# Patient Record
Sex: Male | Born: 1975 | Race: White | Hispanic: No | Marital: Married | Smoking: Never smoker
Health system: Southern US, Community
[De-identification: ages and names within clinical notes are randomized; demographics above are authoritative.]

## PROBLEM LIST (undated history)

## (undated) DIAGNOSIS — C801 Malignant (primary) neoplasm, unspecified: Secondary | ICD-10-CM

## (undated) DIAGNOSIS — F32A Depression, unspecified: Secondary | ICD-10-CM

## (undated) DIAGNOSIS — F419 Anxiety disorder, unspecified: Secondary | ICD-10-CM

## (undated) DIAGNOSIS — E785 Hyperlipidemia, unspecified: Secondary | ICD-10-CM

## (undated) DIAGNOSIS — M1990 Unspecified osteoarthritis, unspecified site: Secondary | ICD-10-CM

## (undated) DIAGNOSIS — I10 Essential (primary) hypertension: Secondary | ICD-10-CM

## (undated) DIAGNOSIS — N200 Calculus of kidney: Secondary | ICD-10-CM

## (undated) HISTORY — PX: COSMETIC SURGERY: SHX468

## (undated) HISTORY — DX: Malignant (primary) neoplasm, unspecified: C80.1

## (undated) HISTORY — DX: Anxiety disorder, unspecified: F41.9

## (undated) HISTORY — DX: Unspecified osteoarthritis, unspecified site: M19.90

## (undated) HISTORY — DX: Depression, unspecified: F32.A

## (undated) HISTORY — PX: KNEE SURGERY: SHX244

## (undated) HISTORY — PX: MOHS SURGERY: SUR867

---

## 2005-01-14 ENCOUNTER — Encounter: Payer: Self-pay | Admitting: Rheumatology

## 2009-10-10 DIAGNOSIS — Z87891 Personal history of nicotine dependence: Secondary | ICD-10-CM | POA: Insufficient documentation

## 2009-12-29 DIAGNOSIS — E7800 Pure hypercholesterolemia, unspecified: Secondary | ICD-10-CM | POA: Insufficient documentation

## 2009-12-29 DIAGNOSIS — E559 Vitamin D deficiency, unspecified: Secondary | ICD-10-CM | POA: Insufficient documentation

## 2015-02-12 LAB — LIPID PANEL
Cholesterol: 267 mg/dL — AB (ref 0–200)
HDL: 46 mg/dL (ref 35–70)
LDL Cholesterol: 186 mg/dL
LDL/HDL RATIO: 4
Triglycerides: 174 mg/dL — AB (ref 40–160)

## 2015-02-12 LAB — CBC AND DIFFERENTIAL
HCT: 49 % (ref 41–53)
Hemoglobin: 17.4 g/dL (ref 13.5–17.5)
NEUTROS ABS: 52 /uL
Platelets: 260 10*3/uL (ref 150–399)
WBC: 6.8 10^3/mL

## 2015-02-12 LAB — TSH: TSH: 3.33 u[IU]/mL (ref <=5.90)

## 2015-02-12 LAB — BASIC METABOLIC PANEL
BUN: 12 mg/dL (ref 4–21)
Creatinine: 1.1 mg/dL (ref <=1.3)
Glucose: 92 mg/dL
Potassium: 4.4 mmol/L (ref 3.4–5.3)
SODIUM: 139 mmol/L (ref 137–147)

## 2015-02-12 LAB — HEPATIC FUNCTION PANEL
ALT: 25 U/L (ref 10–40)
AST: 17 U/L (ref 14–40)
Alkaline Phosphatase: 78 U/L (ref 25–125)
Bilirubin, Total: 0.6 mg/dL

## 2015-02-12 LAB — PSA: PSA: 0.6

## 2015-05-11 DIAGNOSIS — F329 Major depressive disorder, single episode, unspecified: Secondary | ICD-10-CM | POA: Insufficient documentation

## 2015-05-11 DIAGNOSIS — F32A Depression, unspecified: Secondary | ICD-10-CM | POA: Insufficient documentation

## 2015-05-11 DIAGNOSIS — IMO0001 Reserved for inherently not codable concepts without codable children: Secondary | ICD-10-CM | POA: Insufficient documentation

## 2015-05-11 DIAGNOSIS — R030 Elevated blood-pressure reading, without diagnosis of hypertension: Secondary | ICD-10-CM

## 2015-05-19 ENCOUNTER — Ambulatory Visit: Payer: Self-pay | Admitting: Family Medicine

## 2015-07-14 ENCOUNTER — Ambulatory Visit (INDEPENDENT_AMBULATORY_CARE_PROVIDER_SITE_OTHER): Payer: BLUE CROSS/BLUE SHIELD | Admitting: Family Medicine

## 2015-07-14 ENCOUNTER — Encounter: Payer: Self-pay | Admitting: Family Medicine

## 2015-07-14 VITALS — BP 128/90 | HR 82 | Temp 98.3°F | Resp 16 | Ht 68.0 in | Wt 185.0 lb

## 2015-07-14 DIAGNOSIS — F32A Depression, unspecified: Secondary | ICD-10-CM

## 2015-07-14 DIAGNOSIS — R030 Elevated blood-pressure reading, without diagnosis of hypertension: Secondary | ICD-10-CM | POA: Diagnosis not present

## 2015-07-14 DIAGNOSIS — IMO0001 Reserved for inherently not codable concepts without codable children: Secondary | ICD-10-CM

## 2015-07-14 DIAGNOSIS — E7800 Pure hypercholesterolemia, unspecified: Secondary | ICD-10-CM

## 2015-07-14 DIAGNOSIS — F329 Major depressive disorder, single episode, unspecified: Secondary | ICD-10-CM | POA: Diagnosis not present

## 2015-07-14 NOTE — Progress Notes (Signed)
Patient ID: Joe Burns, male   DOB: [birth year], 39 y.o.   MRN: [id number]    Subjective:  HPI   Lipid/Cholesterol, Follow-up:   Last seen for this 2 months ago.  Management changes since that visit include starting LIpitor. He has been taking daily but does at times forgets it-maybe 2 to 3 tablets a month he misses. Last Lipid Panel:    Component Value Date/Time   CHOL 267* 02/12/2015   TRIG 174* 02/12/2015   HDL 46 02/12/2015   LDLCALC 186 02/12/2015     He reports good compliance with treatment. He is not having side effects.  Current symptoms include none and have been stable. Weight trend: stable Prior visit with dietician: no Current diet: not one in place Current exercise: 4 to 5 times a week  Wt Readings from Last 3 Encounters:  07/14/15 185 lb (83.915 kg)  04/28/15 185 lb (83.915 kg)     Depression, Follow-up  He  was last seen for this 3 months ago. Changes made at last visit include none.   He reports good compliance with treatment. He is not having side effects.   He reports good tolerance of treatment. He feels he is Improved since last visit. A little improved maybe.  He does not feel like medication needs to be adjusted.   Elevated blood pressure  On his last visit B/P was 138/98. He has not been checking his B/P outside of the office.      Prior to Admission medications   Medication Sig Start Date End Date Taking? Authorizing Provider  atorvastatin (LIPITOR) 10 MG tablet Take by mouth. 04/28/15   Historical Provider, MD  buPROPion (WELLBUTRIN XL) 300 MG 24 hr tablet Take by mouth. 02/12/15   Historical Provider, MD    Patient Active Problem List   Diagnosis Date Noted  . Clinical depression 05/11/2015  . Blood pressure elevated 05/11/2015  . Hypercholesteremia 12/29/2009  . Avitaminosis D 12/29/2009  . History of tobacco use 10/10/2009    No past medical history on file.  History   Social History  . Marital Status: Married   Spouse Name: Tresa Endo  . Number of Children: 0  . Years of Education: 12   Occupational History  . [employer]    Social History Main Topics  . Smoking status: Never Smoker   . Smokeless tobacco: Former Neurosurgeon    Types: Chew    Quit date: 04/13/2015  . Alcohol Use: Yes     Comment: once or twice a week-2 beers at that time  . Drug Use: No  . Sexual Activity: Yes   Other Topics Concern  . Not on file   Social History Narrative    No Known Allergies  Review of Systems  Constitutional: Negative for fever, chills, malaise/fatigue and diaphoresis.  Respiratory: Negative for cough, hemoptysis, sputum production, shortness of breath and wheezing.   Cardiovascular: Negative for chest pain, palpitations, orthopnea, claudication and leg swelling.  Gastrointestinal: Negative for heartburn, nausea and vomiting.  Musculoskeletal: Negative for myalgias, back pain, falls and neck pain.  Neurological: Negative for dizziness and headaches.  Psychiatric/Behavioral: Negative for depression, suicidal ideas, hallucinations and substance abuse. The patient is not nervous/anxious and does not have insomnia.     Immunization History  Administered Date(s) Administered  . Tdap 09/24/2009   Objective:  BP 128/90 mmHg  Pulse 82  Temp(Src) 98.3 F (36.8 C)  Resp 16  Ht 5\' 8"  (1.727 m)  Wt 185 lb (83.915  kg)  BMI 28.14 kg/m2  Physical Exam  Constitutional: He is oriented to person, place, and time and well-developed, well-nourished, and in no distress.  HENT:  Head: Normocephalic and atraumatic.  Right Ear: External ear normal.  Left Ear: External ear normal.  Nose: Nose normal.  Eyes: Conjunctivae are normal. Pupils are equal, round, and reactive to light.  Neck: Normal range of motion. Neck supple.  Cardiovascular: Normal rate, regular rhythm, normal heart sounds and intact distal pulses.   Pulmonary/Chest: Effort normal and breath sounds normal. No respiratory distress. He has no  wheezes. He has no rales.  Musculoskeletal: Normal range of motion. He exhibits no edema or tenderness.  Neurological: He is alert and oriented to person, place, and time. Gait normal.  Skin: Skin is warm and dry.  Psychiatric: Mood, memory, affect and judgment normal.    Lab Results  Component Value Date   WBC 6.8 02/12/2015   HGB 17.4 02/12/2015   HCT 49 02/12/2015   PLT 260 02/12/2015   CHOL 267* 02/12/2015   TRIG 174* 02/12/2015   HDL 46 02/12/2015   LDLCALC 186 02/12/2015   TSH 3.33 02/12/2015   PSA 0.6 02/12/2015    CMP     Component Value Date/Time   NA 139 02/12/2015   K 4.4 02/12/2015   BUN 12 02/12/2015   CREATININE 1.1 02/12/2015   AST 17 02/12/2015   ALT 25 02/12/2015   ALKPHOS 78 02/12/2015    Assessment and Plan :  1. Hypercholesteremia Will re check levels on Lipitor-pending results.  2. Blood pressure elevated Borderline today. Advised patient to keep check on B/P and bring in readings on the next visit. Continue to exercise and work on habits. Will not make any changes today, will follow. He is not having any cardiac issues at this time.  3. Clinical depression Stable. No changes to medication is needed at this time. Patient feels ok. Continue to follow.Clinically improved.      Patient was seen and examined by Dr. Bosie Clos and note was scribed by Samara Deist, RMA.    Julieanne Manson MD [facility] 07/14/2015 1:52 PM

## 2015-07-15 LAB — HEPATIC FUNCTION PANEL
ALBUMIN: 5.2 g/dL (ref 3.5–5.5)
ALK PHOS: 81 IU/L (ref 39–117)
ALT: 33 IU/L (ref 0–44)
AST: 23 IU/L (ref 0–40)
Bilirubin Total: 0.7 mg/dL (ref 0.0–1.2)
Bilirubin, Direct: 0.16 mg/dL (ref 0.00–0.40)
Total Protein: 7.5 g/dL (ref 6.0–8.5)

## 2015-07-15 LAB — LIPID PANEL WITH LDL/HDL RATIO
Cholesterol, Total: 180 mg/dL (ref 100–199)
HDL: 48 mg/dL (ref >39)
LDL CALC: 106 mg/dL — AB (ref 0–99)
LDl/HDL Ratio: 2.2 ratio units (ref 0.0–3.6)
Triglycerides: 130 mg/dL (ref 0–149)
VLDL Cholesterol Cal: 26 mg/dL (ref 5–40)

## 2015-08-07 ENCOUNTER — Other Ambulatory Visit: Payer: Self-pay | Admitting: Family Medicine

## 2015-09-10 ENCOUNTER — Other Ambulatory Visit: Payer: Self-pay | Admitting: Family Medicine

## 2015-10-16 ENCOUNTER — Other Ambulatory Visit: Payer: Self-pay

## 2015-10-16 DIAGNOSIS — F329 Major depressive disorder, single episode, unspecified: Secondary | ICD-10-CM

## 2015-10-16 DIAGNOSIS — F32A Depression, unspecified: Secondary | ICD-10-CM

## 2015-10-16 NOTE — Telephone Encounter (Signed)
Pharmacy requesting refill. Last refilled on 09/11/15 #30 with 0 refills. Ok to send into pharmacy? Patient's last OV was 07/14/2015.

## 2015-10-20 ENCOUNTER — Other Ambulatory Visit: Payer: Self-pay | Admitting: Family Medicine

## 2015-10-20 MED ORDER — BUPROPION HCL ER (XL) 300 MG PO TB24: 300.0000 mg | ORAL_TABLET | Freq: Every day | ORAL | Status: DC

## 2015-10-20 NOTE — Telephone Encounter (Signed)
Christy with [facility] called to make sure we got refill request and that pt is out of the medication and needs the refill today if possible. Christy asked that the message be marked high priority. Thanks TNP

## 2015-11-03 ENCOUNTER — Ambulatory Visit (INDEPENDENT_AMBULATORY_CARE_PROVIDER_SITE_OTHER): Payer: BLUE CROSS/BLUE SHIELD | Admitting: Family Medicine

## 2015-11-03 ENCOUNTER — Encounter: Payer: Self-pay | Admitting: Family Medicine

## 2015-11-03 VITALS — BP 120/88 | HR 76 | Temp 97.8°F | Resp 16 | Wt 190.0 lb

## 2015-11-03 DIAGNOSIS — F32A Depression, unspecified: Secondary | ICD-10-CM

## 2015-11-03 DIAGNOSIS — F329 Major depressive disorder, single episode, unspecified: Secondary | ICD-10-CM | POA: Diagnosis not present

## 2015-11-03 DIAGNOSIS — R030 Elevated blood-pressure reading, without diagnosis of hypertension: Secondary | ICD-10-CM | POA: Diagnosis not present

## 2015-11-03 DIAGNOSIS — E7800 Pure hypercholesterolemia, unspecified: Secondary | ICD-10-CM

## 2015-11-03 DIAGNOSIS — IMO0001 Reserved for inherently not codable concepts without codable children: Secondary | ICD-10-CM

## 2015-11-03 NOTE — Progress Notes (Signed)
Patient ID: Joe Burns, male   DOB: May 19, 1976, 39 y.o.   MRN: [id number]    Subjective:  HPI  Elevated blood pressure, follow-up:  BP Readings from Last 3 Encounters:  11/03/15 120/88  07/14/15 128/90  04/28/15 138/98    He was last seen for hypertension 3 months ago.  BP at that visit was 128/90. Management since that visit includes none. He reports good compliance with treatment. He is exercising.  Outside blood pressures are 120-150's/80-90's. He is experiencing none.  Patient denies chest pain, chest pressure/discomfort, dyspnea, exertional chest pressure/discomfort, fatigue, irregular heart beat, lower extremity edema and palpitations.   Cardiovascular risk factors include dyslipidemia and male gender.    Wt Readings from Last 3 Encounters:  11/03/15 190 lb (86.183 kg)  07/14/15 185 lb (83.915 kg)  04/28/15 185 lb (83.915 kg)   ------------------------------------------------------------------------  Pt reports that he is emotionally stable and doing well on Wellbutrin.  Prior to Admission medications   Medication Sig Start Date End Date Taking? Authorizing Provider  atorvastatin (LIPITOR) 10 MG tablet TAKE 1 TABLET BY MOUTH EVERY NIGHT AT BEDTIME 10/20/15  Yes Dayzee Trower Hulen Shouts., MD  buPROPion (WELLBUTRIN XL) 300 MG 24 hr tablet Take 1 tablet (300 mg total) by mouth daily. 10/20/15  Yes Asherah Lavoy Hulen Shouts., MD    Patient Active Problem List   Diagnosis Date Noted  . Clinical depression 05/11/2015  . Blood pressure elevated 05/11/2015  . Hypercholesteremia 12/29/2009  . Avitaminosis D 12/29/2009  . History of tobacco use 10/10/2009    History reviewed. No pertinent past medical history.  Social History   Social History  . Marital Status: Married    Spouse Name: Tresa Endo  . Number of Children: 0  . Years of Education: 12   Occupational History  . [employer]    Social History Main Topics  . Smoking status: Never Smoker   . Smokeless  tobacco: Former Neurosurgeon    Types: Chew    Quit date: 04/13/2015  . Alcohol Use: Yes     Comment: once or twice a week-2 beers at that time  . Drug Use: No  . Sexual Activity: Yes   Other Topics Concern  . Not on file   Social History Narrative    No Known Allergies  Review of Systems  Constitutional: Negative.   HENT: Negative.   Eyes: Negative.   Respiratory: Negative.   Cardiovascular: Negative.   Gastrointestinal: Negative.   Genitourinary: Negative.   Musculoskeletal: Negative.   Skin: Negative.   Neurological: Negative.   Endo/Heme/Allergies: Negative.   Psychiatric/Behavioral: Negative.     Immunization History  Administered Date(s) Administered  . Tdap 09/24/2009   Objective:  BP 120/88 mmHg  Pulse 76  Temp(Src) 97.8 F (36.6 C) (Oral)  Resp 16  Wt 190 lb (86.183 kg)  Physical Exam  Constitutional: He is oriented to person, place, and time and well-developed, well-nourished, and in no distress.  HENT:  Head: Normocephalic and atraumatic.  Right Ear: External ear normal.  Left Ear: External ear normal.  Nose: Nose normal.  Eyes: Conjunctivae and EOM are normal. Pupils are equal, round, and reactive to light.  Neck: Normal range of motion. Neck supple.  Cardiovascular: Normal rate, regular rhythm, normal heart sounds and intact distal pulses.   Pulmonary/Chest: Effort normal and breath sounds normal.  Abdominal: Soft.  Musculoskeletal: Normal range of motion.  Neurological: He is alert and oriented to person, place, and time. He has normal reflexes. Gait  normal. GCS score is 15.  Skin: Skin is warm and dry.  Psychiatric: Mood, memory, affect and judgment normal.    Lab Results  Component Value Date   WBC 6.8 02/12/2015   HGB 17.4 02/12/2015   HCT 49 02/12/2015   PLT 260 02/12/2015   CHOL 180 07/14/2015   TRIG 130 07/14/2015   HDL 48 07/14/2015   LDLCALC 106* 07/14/2015   TSH 3.33 02/12/2015   PSA 0.6 02/12/2015    CMP     Component Value  Date/Time   NA 139 02/12/2015   K 4.4 02/12/2015   BUN 12 02/12/2015   CREATININE 1.1 02/12/2015   PROT 7.5 07/14/2015 1420   ALBUMIN 5.2 07/14/2015 1420   AST 23 07/14/2015 1420   ALT 33 07/14/2015 1420   ALKPHOS 81 07/14/2015 1420   BILITOT 0.7 07/14/2015 1420    Assessment and Plan :  1. Blood pressure elevated Still borderline. Will continue to follow. Pt to increase cardio, in hopes it will keep his BP down. Continue to monitor at home, if get more elevated, call to come back in if not, Follow up in 6 months.  2. Clinical depression Stable. Return to clinic 6 months Continue present medications. 3. Hyperlipidemia stable. Continue Atorvastatin.  Labs okay 3 months ago. Patient was seen and examined by Dr. Julieanne Mansonichard Zaiya Annunziato, and noted scribed by Dimas ChyleBrittany Byrd, CMA  Julieanne Mansonichard Adynn Caseres MD [facility] 11/03/2015 8:51 AM

## 2016-05-04 ENCOUNTER — Ambulatory Visit: Payer: BLUE CROSS/BLUE SHIELD | Admitting: Family Medicine

## 2016-05-19 ENCOUNTER — Ambulatory Visit (INDEPENDENT_AMBULATORY_CARE_PROVIDER_SITE_OTHER): Payer: 59 | Admitting: Family Medicine

## 2016-05-19 ENCOUNTER — Encounter: Payer: Self-pay | Admitting: Family Medicine

## 2016-05-19 VITALS — BP 124/88 | HR 82 | Temp 97.7°F | Resp 16 | Wt 189.0 lb

## 2016-05-19 DIAGNOSIS — F329 Major depressive disorder, single episode, unspecified: Secondary | ICD-10-CM

## 2016-05-19 DIAGNOSIS — E7800 Pure hypercholesterolemia, unspecified: Secondary | ICD-10-CM

## 2016-05-19 DIAGNOSIS — IMO0001 Reserved for inherently not codable concepts without codable children: Secondary | ICD-10-CM

## 2016-05-19 DIAGNOSIS — F32A Depression, unspecified: Secondary | ICD-10-CM

## 2016-05-19 DIAGNOSIS — R030 Elevated blood-pressure reading, without diagnosis of hypertension: Secondary | ICD-10-CM | POA: Diagnosis not present

## 2016-05-19 NOTE — Progress Notes (Signed)
Patient ID: Joe Burns, male   DOB: 1976/11/29, 40 y.o.   MRN: [id number]    Subjective:  HPI Elevated blood pressure- Pt is here for a 6 month follow up. His blood pressure has been borderline high. His BP at last OV on 11/03/15 was 120/88. Pt reports that he is exercising at least 3-4 times a week. He does not check it regularly anymore but when he does check it, it seems to be better. He reports that he is separated from his wife and feels his blood pressure is not as high due to this. He has also stopped taking his Wellbutrin because he did not feel it was doing any good any ways. He has not felt any different after stopping the medication.   Prior to Admission medications   Medication Sig Start Date End Date Taking? Authorizing Provider  atorvastatin (LIPITOR) 10 MG tablet TAKE 1 TABLET BY MOUTH EVERY NIGHT AT BEDTIME 10/20/15  Yes Richard Hulen Shouts., MD  buPROPion (WELLBUTRIN XL) 300 MG 24 hr tablet Take 1 tablet (300 mg total) by mouth daily. Patient not taking: Reported on 05/19/2016 10/20/15   Maple Hudson., MD    Patient Active Problem List   Diagnosis Date Noted  . Clinical depression 05/11/2015  . Blood pressure elevated 05/11/2015  . Hypercholesteremia 12/29/2009  . Avitaminosis D 12/29/2009  . History of tobacco use 10/10/2009    History reviewed. No pertinent past medical history.  Social History   Social History  . Marital Status: Married    Spouse Name: Tresa Endo  . Number of Children: 0  . Years of Education: 12   Occupational History  . [employer]    Social History Main Topics  . Smoking status: Never Smoker   . Smokeless tobacco: Former Neurosurgeon    Types: Chew    Quit date: 04/13/2015  . Alcohol Use: Yes     Comment: once or twice a week-2 beers at that time  . Drug Use: No  . Sexual Activity: Yes   Other Topics Concern  . Not on file   Social History Narrative    No Known Allergies  Review of Systems  Constitutional: Negative.     HENT: Negative.   Eyes: Negative.   Respiratory: Negative.   Cardiovascular: Negative.   Gastrointestinal: Negative.   Genitourinary: Negative.   Musculoskeletal: Negative.   Skin: Negative.   Neurological: Negative.   Endo/Heme/Allergies: Negative.   Psychiatric/Behavioral: Negative.     Immunization History  Administered Date(s) Administered  . Tdap 09/24/2009   Objective:  BP 124/88 mmHg  Pulse 82  Temp(Src) 97.7 F (36.5 C) (Oral)  Resp 16  Wt 189 lb (85.73 kg)  Physical Exam  Constitutional: He is oriented to person, place, and time and well-developed, well-nourished, and in no distress.  HENT:  Head: Normocephalic and atraumatic.  Right Ear: External ear normal.  Left Ear: External ear normal.  Nose: Nose normal.  Eyes: Conjunctivae and EOM are normal. Pupils are equal, round, and reactive to light.  Neck: Normal range of motion. Neck supple.  Cardiovascular: Normal rate, regular rhythm, normal heart sounds and intact distal pulses.   Pulmonary/Chest: Effort normal and breath sounds normal.  Musculoskeletal: Normal range of motion.  Neurological: He is alert and oriented to person, place, and time. He has normal reflexes. Gait normal. GCS score is 15.  Skin: Skin is warm and dry.  Nails normal.  Psychiatric: Mood, memory, affect and judgment normal.  Lab Results  Component Value Date   WBC 6.8 02/12/2015   HGB 17.4 02/12/2015   HCT 49 02/12/2015   PLT 260 02/12/2015   CHOL 180 07/14/2015   TRIG 130 07/14/2015   HDL 48 07/14/2015   LDLCALC 106* 07/14/2015   TSH 3.33 02/12/2015   PSA 0.6 02/12/2015    CMP     Component Value Date/Time   NA 139 02/12/2015   K 4.4 02/12/2015   BUN 12 02/12/2015   CREATININE 1.1 02/12/2015   PROT 7.5 07/14/2015 1420   ALBUMIN 5.2 07/14/2015 1420   AST 23 07/14/2015 1420   ALT 33 07/14/2015 1420   ALKPHOS 81 07/14/2015 1420   BILITOT 0.7 07/14/2015 1420    Assessment and Plan :  1.  Hypercholesteremia   2. Blood pressure elevated Improved.   3. Clinical depression Pt stopped Wellbutrin, doing well.   CPE this fall. Labs then. Patient was seen and examined by Dr. Julieanne Mansonichard Gilbert, and noted scribed by Dimas ChyleBrittany Byrd, CMA I have done the exam and reviewed the above chart and it is accurate to the best of my knowledge.  Julieanne Mansonichard Gilbert MD [facility] 05/19/2016 9:08 AM

## 2016-06-11 ENCOUNTER — Encounter: Payer: Self-pay | Admitting: Family Medicine

## 2016-06-11 ENCOUNTER — Other Ambulatory Visit: Payer: Self-pay | Admitting: Family Medicine

## 2016-06-11 ENCOUNTER — Ambulatory Visit (INDEPENDENT_AMBULATORY_CARE_PROVIDER_SITE_OTHER): Payer: 59 | Admitting: Family Medicine

## 2016-06-11 VITALS — BP 140/88 | HR 88 | Temp 97.8°F | Resp 16 | Wt 192.0 lb

## 2016-06-11 DIAGNOSIS — N410 Acute prostatitis: Secondary | ICD-10-CM

## 2016-06-11 LAB — POCT URINALYSIS DIPSTICK
BILIRUBIN UA: NEGATIVE
Glucose, UA: NEGATIVE
KETONES UA: NEGATIVE
Nitrite, UA: NEGATIVE
PH UA: 7
PROTEIN UA: NEGATIVE
SPEC GRAV UA: 1.025
Urobilinogen, UA: 0.2

## 2016-06-11 MED ORDER — DOXYCYCLINE HYCLATE 100 MG PO TABS: 100.0000 mg | ORAL_TABLET | Freq: Two times a day (BID) | ORAL | Status: DC

## 2016-06-11 NOTE — Progress Notes (Signed)
Patient: Joe Burns Male    DOB: [birth year]   40 y.o.   MRN: [id number] Visit Date: 06/11/2016  Today's Provider: Dortha Kernennis Chrismon, PA   Chief Complaint  Patient presents with  . Dysuria   Subjective:    Dysuria  The current episode started more than 1 month ago. The problem occurs intermittently. The patient is experiencing no pain. There has been no fever. Associated symptoms include frequency and urgency. Pertinent negatives include no hematuria.   Patient feels that he may have a prostate infection. He reports that his urinary stream is not as strong as it normally is, and the last time this happened is when he had a prostate infection. Patient reports that he does have to urinate more frequently, and he does feel discomfort after urinating. Patient reports that he has had symptoms a little over 1 month.    No past medical history on file. Patient Active Problem List   Diagnosis Date Noted  . Clinical depression 05/11/2015  . Blood pressure elevated 05/11/2015  . Hypercholesteremia 12/29/2009  . Avitaminosis D 12/29/2009  . History of tobacco use 10/10/2009   Past Surgical History  Procedure Laterality Date  . Knee surgery Right   . Mohs surgery     Family History  Problem Relation Age of Onset  . Hyperlipidemia Mother   . Aneurysm Mother     AORTIC ABDOMINAL-REPAIRED  . Heart attack Father     IN HIS 50S   No Known Allergies   Current Meds  Medication Sig  . atorvastatin (LIPITOR) 10 MG tablet TAKE 1 TABLET BY MOUTH EVERY NIGHT AT BEDTIME    Review of Systems  Constitutional: Negative.   Genitourinary: Positive for dysuria, urgency, frequency, decreased urine volume and difficulty urinating. Negative for hematuria, discharge, penile swelling, scrotal swelling, penile pain and testicular pain.    Social History  Substance Use Topics  . Smoking status: Never Smoker   . Smokeless tobacco: Former NeurosurgeonUser    Types: Chew    Quit date: 04/13/2015  .  Alcohol Use: Yes     Comment: once or twice a week-2 beers at that time   Objective:   BP 140/88 mmHg  Pulse 88  Temp(Src) 97.8 F (36.6 C)  Resp 16  Wt 192 lb (87.091 kg)  Physical Exam  Constitutional: He is oriented to person, place, and time. He appears well-developed and well-nourished. No distress.  HENT:  Head: Normocephalic and atraumatic.  Right Ear: Hearing normal.  Left Ear: Hearing normal.  Nose: Nose normal.  Eyes: Conjunctivae and lids are normal. Right eye exhibits no discharge. Left eye exhibits no discharge. No scleral icterus.  Pulmonary/Chest: Effort normal. No respiratory distress.  Genitourinary: Rectum normal and penis normal.  3+ enlarged prostate without nodules and slight discomfort.  Musculoskeletal: Normal range of motion.  Neurological: He is alert and oriented to person, place, and time.  Skin: Skin is intact. No lesion and no rash noted.  Psychiatric: He has a normal mood and affect. His speech is normal and behavior is normal. Thought content normal.      Assessment & Plan:     1. Acute prostatitis Onset of frequency, decrease in erectile function, slowed stream and some discomfort with urinating over the past month. States the symptoms are the same as his last episode of prostatitis in 2013. Will get urine culture. DRE showed enlarged prostate - stool negative for occult blood. Urinalysis not very remarkable ( only a  trace of blood ). Start Doxycycline and recheck pending C&S. - doxycycline (VIBRA-TABS) 100 MG tablet; Take 1 tablet (100 mg total) by mouth 2 (two) times daily.  Dispense: 28 tablet; Refill: 0 - Urine culture - POCT urinalysis dipstick      Dortha Kernennis Chrismon, PA  [facility]

## 2016-06-11 NOTE — Patient Instructions (Signed)

## 2016-06-14 LAB — URINE CULTURE: Organism ID, Bacteria: NO GROWTH

## 2016-06-17 ENCOUNTER — Telehealth: Payer: Self-pay | Admitting: Family Medicine

## 2016-06-17 NOTE — Telephone Encounter (Signed)
Advise patient urine culture negative. Not uncommon to have negative urine culture with prostatitis. Finish the antibiotic given and recheck if symptoms persist.

## 2016-06-17 NOTE — Telephone Encounter (Signed)
Advised patient as below. Patient will call for an update next week.

## 2016-06-23 ENCOUNTER — Telehealth: Payer: Self-pay | Admitting: Family Medicine

## 2016-06-23 DIAGNOSIS — N410 Acute prostatitis: Secondary | ICD-10-CM

## 2016-06-23 NOTE — Telephone Encounter (Signed)
Pt contacted office for refill request on the following medications:  doxycycline (VIBRA-TABS) 100 MG tablet.  [facility].  CB#[id number]/MW

## 2016-06-23 NOTE — Telephone Encounter (Signed)
Patient was seen on 06/11/16 with acute prostatitis. Patient is still having symptoms and requesting another round of abx. Ok to send? Please advise. Thanks!

## 2016-06-24 MED ORDER — DOXYCYCLINE HYCLATE 100 MG PO TABS: 100.0000 mg | ORAL_TABLET | Freq: Two times a day (BID) | ORAL | Status: DC

## 2016-06-24 NOTE — Telephone Encounter (Signed)
Advised patient as below.  

## 2016-06-24 NOTE — Telephone Encounter (Signed)
Will refill the antibiotic once more. Should recheck in 2 weeks to evaluate progress.

## 2016-08-18 ENCOUNTER — Ambulatory Visit (INDEPENDENT_AMBULATORY_CARE_PROVIDER_SITE_OTHER): Payer: 59 | Admitting: Family Medicine

## 2016-08-18 VITALS — BP 128/84 | HR 80 | Temp 98.3°F | Resp 18 | Wt 181.0 lb

## 2016-08-18 DIAGNOSIS — E7800 Pure hypercholesterolemia, unspecified: Secondary | ICD-10-CM

## 2016-08-18 DIAGNOSIS — F329 Major depressive disorder, single episode, unspecified: Secondary | ICD-10-CM | POA: Diagnosis not present

## 2016-08-18 DIAGNOSIS — F32A Depression, unspecified: Secondary | ICD-10-CM

## 2016-08-18 MED ORDER — ESCITALOPRAM OXALATE 20 MG PO TABS: 20.0000 mg | ORAL_TABLET | Freq: Every day | ORAL | 5 refills | Status: DC

## 2016-08-18 NOTE — Progress Notes (Signed)
Subjective:  HPI  Patient is here to follow up on depression and anxiety. Last visit in June he was doing well off medication but he is going through separation right now and his father just passed away and is not doing as good. He has been seen a counselor and she suggested discussing starting on medication like Lexapro or Zoloft maybe with combination with Wellbutrin. He has not been taking Wellbutrin it was not very effective when he did take it even with increased dose.  Depression screen PHQ 2/9 08/18/2016  Decreased Interest 2  Down, Depressed, Hopeless 1  PHQ - 2 Score 3  Altered sleeping 0  Tired, decreased energy 1  Change in appetite 2  Feeling bad or failure about yourself  1  Trouble concentrating 3  Moving slowly or fidgety/restless 0  Suicidal thoughts 0  PHQ-9 Score 10  Difficult doing work/chores Somewhat difficult     Prior to Admission medications   Medication Sig Start Date End Date Taking? Authorizing Provider  atorvastatin (LIPITOR) 10 MG tablet TAKE 1 TABLET BY MOUTH EVERY NIGHT AT BEDTIME 10/20/15   Richard Hulen Shouts., MD  buPROPion (WELLBUTRIN XL) 300 MG 24 hr tablet Take 1 tablet (300 mg total) by mouth daily. Patient not taking: Reported on 05/19/2016 10/20/15   Maple Hudson., MD  doxycycline (VIBRA-TABS) 100 MG tablet Take 1 tablet (100 mg total) by mouth 2 (two) times daily. 06/24/16   Tamsen Roers, PA    Patient Active Problem List   Diagnosis Date Noted  . Clinical depression 05/11/2015  . Blood pressure elevated 05/11/2015  . Hypercholesteremia 12/29/2009  . Avitaminosis D 12/29/2009  . History of tobacco use 10/10/2009    No past medical history on file.  Social History   Social History  . Marital status: Married    Spouse name: Tresa Endo  . Number of children: 0  . Years of education: 12   Occupational History  . [employer]    Social History Main Topics  . Smoking status: Never Smoker  . Smokeless tobacco:  Former Neurosurgeon    Types: Chew    Quit date: 04/13/2015  . Alcohol use Yes     Comment: once or twice a week-2 beers at that time  . Drug use: No  . Sexual activity: Yes   Other Topics Concern  . Not on file   Social History Narrative  . No narrative on file    No Known Allergies  Review of Systems  Constitutional: Positive for malaise/fatigue.  Eyes: Negative.   Respiratory: Negative.   Cardiovascular: Negative.   Gastrointestinal: Negative.   Genitourinary:       Decreased sex drive  Skin: Negative.   Endo/Heme/Allergies: Negative.   Psychiatric/Behavioral: Positive for depression. The patient is nervous/anxious.     Immunization History  Administered Date(s) Administered  . Tdap 09/24/2009   Objective:  BP 128/84   Pulse 80   Temp 98.3 F (36.8 C)   Resp 18   Wt 181 lb (82.1 kg)   BMI 27.52 kg/m   Physical Exam  Constitutional: He is oriented to person, place, and time and well-developed, well-nourished, and in no distress.  HENT:  Head: Normocephalic and atraumatic.  Right Ear: External ear normal.  Left Ear: External ear normal.  Nose: Nose normal.  Eyes: Conjunctivae are normal. Pupils are equal, round, and reactive to light.  Neck: Normal range of motion. Neck supple.  Cardiovascular: Normal rate, regular rhythm,  normal heart sounds and intact distal pulses.   No murmur heard. Pulmonary/Chest: Effort normal and breath sounds normal. No respiratory distress. He has no wheezes.  Abdominal: Soft.  Musculoskeletal: Normal range of motion. He exhibits no edema or tenderness.  Neurological: He is alert and oriented to person, place, and time. Gait normal.  Skin: Skin is warm and dry.  Psychiatric: Memory, affect and judgment normal.    Lab Results  Component Value Date   WBC 6.8 02/12/2015   HGB 17.4 02/12/2015   HCT 49 02/12/2015   PLT 260 02/12/2015   CHOL 180 07/14/2015   TRIG 130 07/14/2015   HDL 48 07/14/2015   LDLCALC 106 (H) 07/14/2015   TSH  3.33 02/12/2015   PSA 0.6 02/12/2015    CMP     Component Value Date/Time   NA 139 02/12/2015   K 4.4 02/12/2015   BUN 12 02/12/2015   CREATININE 1.1 02/12/2015   PROT 7.5 07/14/2015 1420   ALBUMIN 5.2 07/14/2015 1420   AST 23 07/14/2015 1420   ALT 33 07/14/2015 1420   ALKPHOS 81 07/14/2015 1420   BILITOT 0.7 07/14/2015 1420    Assessment and Plan :  1. Clinical depression PHQ9 score is 10 today. Start Lexapro. Follow up in 1 month. Both mother and sister are doing well on Lexapro. Explained the patient that this is probably the best choice because of manic issues. 2. Hypercholesteremia     HPI, Exam and A&P transcribed under the direction and in the presence of Julieanne Mansonichard Gilbert. MD. I have done the exam and reviewed the above chart and it is accurate to the best of my knowledge.  08/18/2016 8:24 AM

## 2016-09-14 ENCOUNTER — Ambulatory Visit: Payer: 59 | Admitting: Family Medicine

## 2016-11-18 ENCOUNTER — Ambulatory Visit (INDEPENDENT_AMBULATORY_CARE_PROVIDER_SITE_OTHER): Payer: Managed Care, Other (non HMO) | Admitting: Family Medicine

## 2016-11-18 ENCOUNTER — Encounter: Payer: Self-pay | Admitting: Family Medicine

## 2016-11-18 VITALS — BP 122/86 | HR 80 | Temp 98.0°F | Resp 14 | Ht 68.0 in | Wt 189.0 lb

## 2016-11-18 DIAGNOSIS — F329 Major depressive disorder, single episode, unspecified: Secondary | ICD-10-CM | POA: Diagnosis not present

## 2016-11-18 DIAGNOSIS — Z125 Encounter for screening for malignant neoplasm of prostate: Secondary | ICD-10-CM | POA: Diagnosis not present

## 2016-11-18 DIAGNOSIS — R0681 Apnea, not elsewhere classified: Secondary | ICD-10-CM

## 2016-11-18 DIAGNOSIS — R0683 Snoring: Secondary | ICD-10-CM | POA: Diagnosis not present

## 2016-11-18 DIAGNOSIS — F32A Depression, unspecified: Secondary | ICD-10-CM

## 2016-11-18 DIAGNOSIS — Z0000 Encounter for general adult medical examination without abnormal findings: Secondary | ICD-10-CM

## 2016-11-18 LAB — POCT URINALYSIS DIPSTICK
Bilirubin, UA: NEGATIVE
GLUCOSE UA: NEGATIVE
KETONES UA: NEGATIVE
Leukocytes, UA: NEGATIVE
Nitrite, UA: NEGATIVE
PROTEIN UA: NEGATIVE
SPEC GRAV UA: 1.02
UROBILINOGEN UA: 0.2
pH, UA: 6.5

## 2016-11-18 NOTE — Progress Notes (Signed)
Patient: Joe PandyGary D Scogin, Male    DOB: November 06, 1976, 40 y.o.   MRN: [id number] Visit Date: 11/18/2016  Today's Provider: Megan Mansichard Zafirah Vanzee Jr, MD   Chief Complaint  Patient presents with  . Annual Exam   Subjective:    Annual physical exam Joe Burns is a 40 y.o. male who presents today for health maintenance and complete physical. He feels fairly well. He reports exercising 5 times a week. He reports he is sleeping poorly, has been told he stops breathing at night. He is divorced times 2 and has 2 teenage stepchildren who he misses very much. He is getting counselling and is feeling better emotionally.He was married to his first wife for 13 years and he says the marriage ended when she was unfaithful.The second marriage lasted 4 years. His counsellor is Games developerKathy.  He does comment that he snores and people say that he does have apnea.  -----------------------------------------------------------------   Review of Systems  Constitutional: Positive for fatigue.  HENT: Negative.   Eyes: Negative.   Respiratory: Negative.   Cardiovascular: Negative.   Gastrointestinal: Negative.   Endocrine: Negative.   Genitourinary: Negative.   Musculoskeletal: Positive for arthralgias.  Skin: Negative.   Allergic/Immunologic: Negative.   Neurological: Negative.   Hematological: Negative.   Psychiatric/Behavioral: Negative.     Social History      He  reports that he has never smoked. His smokeless tobacco use includes Chew. He reports that he drinks alcohol. He reports that he does not use drugs.       Social History   Social History  . Marital status: Married    Spouse name: Tresa EndoKelly  . Number of children: 0  . Years of education: 12   Occupational History  . [employer]    Social History Main Topics  . Smoking status: Never Smoker  . Smokeless tobacco: Current User    Types: Chew    Last attempt to quit: 04/13/2015  . Alcohol use Yes     Comment: once or twice a week-2  beers at that time  . Drug use: No  . Sexual activity: Yes   Other Topics Concern  . None   Social History Narrative  . None    History reviewed. No pertinent past medical history.   Patient Active Problem List   Diagnosis Date Noted  . Clinical depression 05/11/2015  . Blood pressure elevated 05/11/2015  . Hypercholesteremia 12/29/2009  . Avitaminosis D 12/29/2009  . History of tobacco use 10/10/2009    Past Surgical History:  Procedure Laterality Date  . KNEE SURGERY Right   . MOHS SURGERY      Family History        Family Status  Relation Status  . Mother Alive  . Father Alive  . Sister Alive  . Sister Alive        His family history includes Aneurysm in his mother; Heart attack in his father; Hyperlipidemia in his mother.     No Known Allergies   Current Outpatient Prescriptions:  .  atorvastatin (LIPITOR) 10 MG tablet, TAKE 1 TABLET BY MOUTH EVERY NIGHT AT BEDTIME, Disp: 30 tablet, Rfl: 12   Patient Care Team: Maple Hudsonichard L Iesha Summerhill Jr., MD as PCP - General (Family Medicine)      Objective:   Vitals: BP 122/86 (BP Location: Left Arm, Patient Position: Sitting, Cuff Size: Large)   Pulse 80   Temp 98 F (36.7 C) (Oral)   Resp 14  Ht 5\' 8"  (1.727 m)   Wt 189 lb (85.7 kg)   BMI 28.74 kg/m    Physical Exam  Constitutional: He is oriented to person, place, and time. He appears well-developed and well-nourished.  HENT:  Head: Normocephalic and atraumatic.  Right Ear: External ear normal.  Left Ear: External ear normal.  Nose: Nose normal.  Mouth/Throat: Oropharynx is clear and moist.  Tonsils 2+ bilaterally.  Eyes: Conjunctivae and EOM are normal. Pupils are equal, round, and reactive to light.  Neck: Normal range of motion. Neck supple.  Cardiovascular: Normal rate, regular rhythm, normal heart sounds and intact distal pulses.   Pulmonary/Chest: Effort normal and breath sounds normal.  Abdominal: Soft. Bowel sounds are normal.  Genitourinary:  Penis normal.  Genitourinary Comments: Pt had DRE May 201`7.  Musculoskeletal: Normal range of motion.  Neurological: He is alert and oriented to person, place, and time. He has normal reflexes.  Skin: Skin is warm and dry.  Psychiatric: He has a normal mood and affect. His behavior is normal. Judgment and thought content normal.     Depression Screen PHQ 2/9 Scores 11/18/2016 08/18/2016  PHQ - 2 Score 1 3  PHQ- 9 Score 4 10      Assessment & Plan:     Routine Health Maintenance and Physical Exam  Exercise Activities and Dietary recommendations Goals    None      Immunization History  Administered Date(s) Administered  . Tdap 09/24/2009    Health Maintenance  Topic Date Due  . HIV Screening  05/30/1989  . INFLUENZA VACCINE  11/18/2017 (Originally 07/13/2016)  . TETANUS/TDAP  09/25/2019   Discussed health benefits of physical activity, and encouraged him to engage in regular exercise appropriate for his age and condition.    -------------------------------------------------------------------- 1. Annual physical exam DRE at 2018 CPE. - POCT urinalysis dipstick - TSH - CBC with Differential/Platelet - Lipid Panel With LDL/HDL Ratio - Comprehensive metabolic panel  2. Apnea - Ambulatory referral to Sleep Studies Snoring plus witnessed apnea. 3. Snoring Pt scored a 12 on the Epworth sleepiness scale.  - Ambulatory referral to Sleep Studies Follow up in 2-3 months or after sleep study to discuss treatment and management, if it is needed.   4. Prostate cancer screening  - PSA  5. Depression, unspecified depression type PHQ-9 score is 4 today.  6.Basal Cell Carcinoma Sees Dr Gwen PoundsKowalski. HPI, Exam, and A&P Transcribed under the direction and in the presence of Zetta Stoneman L. Wendelyn BreslowGilbert Jr, MD  Electronically Signed: Dimas ChyleBrittany Byrd, CMA. I have done the exam and reviewed the above chart and it is accurate to the best of my knowledge. [employer]ist[employer]agon  technology has been used in this  note in any air is in the dictation or transcription are unintentional.  Megan Mansichard Daine Gunther Jr, MD  [facility]

## 2016-11-19 LAB — PSA: PROSTATE SPECIFIC AG, SERUM: 1 ng/mL (ref 0.0–4.0)

## 2016-11-19 LAB — COMPREHENSIVE METABOLIC PANEL
ALK PHOS: 66 IU/L (ref 39–117)
ALT: 35 IU/L (ref 0–44)
AST: 27 IU/L (ref 0–40)
Albumin/Globulin Ratio: 1.9 (ref 1.2–2.2)
Albumin: 5 g/dL (ref 3.5–5.5)
BUN/Creatinine Ratio: 13 (ref 9–20)
BUN: 15 mg/dL (ref 6–24)
Bilirubin Total: 0.5 mg/dL (ref 0.0–1.2)
CHLORIDE: 98 mmol/L (ref 96–106)
CO2: 26 mmol/L (ref 18–29)
CREATININE: 1.2 mg/dL (ref 0.76–1.27)
Calcium: 9.9 mg/dL (ref 8.7–10.2)
GFR calc Af Amer: 86 mL/min/{1.73_m2} (ref >59)
GFR calc non Af Amer: 74 mL/min/{1.73_m2} (ref >59)
GLUCOSE: 81 mg/dL (ref 65–99)
Globulin, Total: 2.6 g/dL (ref 1.5–4.5)
Potassium: 4.7 mmol/L (ref 3.5–5.2)
SODIUM: 141 mmol/L (ref 134–144)
Total Protein: 7.6 g/dL (ref 6.0–8.5)

## 2016-11-19 LAB — CBC WITH DIFFERENTIAL/PLATELET
BASOS: 0 %
Basophils Absolute: 0 10*3/uL (ref 0.0–0.2)
EOS (ABSOLUTE): 0.3 10*3/uL (ref 0.0–0.4)
Eos: 4 %
HEMATOCRIT: 52.5 % — AB (ref 37.5–51.0)
HEMOGLOBIN: 18.4 g/dL — AB (ref 13.0–17.7)
IMMATURE GRANS (ABS): 0 10*3/uL (ref 0.0–0.1)
Immature Granulocytes: 0 %
LYMPHS: 35 %
Lymphocytes Absolute: 2.4 10*3/uL (ref 0.7–3.1)
MCH: 30.5 pg (ref 26.6–33.0)
MCHC: 35 g/dL (ref 31.5–35.7)
MCV: 87 fL (ref 79–97)
MONOCYTES: 7 %
Monocytes Absolute: 0.5 10*3/uL (ref 0.1–0.9)
NEUTROS ABS: 3.6 10*3/uL (ref 1.4–7.0)
Neutrophils: 54 %
Platelets: 237 10*3/uL (ref 150–379)
RBC: 6.04 x10E6/uL — ABNORMAL HIGH (ref 4.14–5.80)
RDW: 14.1 % (ref 12.3–15.4)
WBC: 6.8 10*3/uL (ref 3.4–10.8)

## 2016-11-19 LAB — LIPID PANEL WITH LDL/HDL RATIO
Cholesterol, Total: 187 mg/dL (ref 100–199)
HDL: 44 mg/dL (ref >39)
LDL CALC: 121 mg/dL — AB (ref 0–99)
LDl/HDL Ratio: 2.8 ratio units (ref 0.0–3.6)
Triglycerides: 111 mg/dL (ref 0–149)
VLDL CHOLESTEROL CAL: 22 mg/dL (ref 5–40)

## 2016-11-19 LAB — TSH: TSH: 1.71 u[IU]/mL (ref 0.450–4.500)

## 2017-01-12 ENCOUNTER — Other Ambulatory Visit: Payer: Self-pay | Admitting: Family Medicine

## 2017-01-19 ENCOUNTER — Telehealth: Payer: Self-pay | Admitting: Family Medicine

## 2017-01-19 NOTE — Telephone Encounter (Signed)
Insurance denied in lab sleep study.Order faxed to [facility] for home sleep study

## 2017-02-22 ENCOUNTER — Encounter: Payer: Self-pay | Admitting: Family Medicine

## 2017-04-05 ENCOUNTER — Encounter: Payer: Self-pay | Admitting: Family Medicine

## 2017-04-05 ENCOUNTER — Ambulatory Visit (INDEPENDENT_AMBULATORY_CARE_PROVIDER_SITE_OTHER): Payer: Managed Care, Other (non HMO) | Admitting: Family Medicine

## 2017-04-05 VITALS — BP 120/88 | HR 84 | Temp 98.1°F | Resp 16 | Wt 192.0 lb

## 2017-04-05 DIAGNOSIS — J418 Mixed simple and mucopurulent chronic bronchitis: Secondary | ICD-10-CM | POA: Diagnosis not present

## 2017-04-05 MED ORDER — AZITHROMYCIN 250 MG PO TABS: ORAL_TABLET | ORAL | 0 refills | Status: DC

## 2017-04-05 NOTE — Progress Notes (Signed)
Patient: Joe Burns Male    DOB: July 21, 1976   41 y.o.   MRN: [id number] Visit Date: 04/05/2017  Today's Provider: Megan Mans, MD   Chief Complaint  Patient presents with  . URI   Subjective:    URI   This is a new problem. The current episode started more than 1 month ago (for a month and a half. Improved but then worsened about 4 days ago). The problem has been waxing and waning. The maximum temperature recorded prior to his arrival was 100.4 - 100.9 F. Associated symptoms include chest pain (with coughing), congestion, coughing (productive. Has been clear to yellow to lime green), diarrhea, headaches, a plugged ear sensation, rhinorrhea, sinus pain, sneezing, a sore throat, swollen glands (right worse than left) and wheezing (some). Pertinent negatives include no abdominal pain, dysuria, ear pain, nausea, neck pain or vomiting. Treatments tried: Claritin, Mucinex, Sudafed. The treatment provided mild relief.  Pt has a H/O PNA when pt was in his 89's. Pt states this feels similar.     No Known Allergies   Current Outpatient Prescriptions:  .  atorvastatin (LIPITOR) 10 MG tablet, take 1 tablet by mouth at bedtime, Disp: 90 tablet, Rfl: 12  Review of Systems  HENT: Positive for congestion, rhinorrhea, sinus pain, sneezing and sore throat. Negative for ear pain.   Respiratory: Positive for cough (productive. Has been clear to yellow to lime green) and wheezing (some).   Cardiovascular: Positive for chest pain (with coughing).  Gastrointestinal: Positive for diarrhea. Negative for abdominal pain, nausea and vomiting.  Genitourinary: Negative for dysuria.  Musculoskeletal: Negative for neck pain.  Neurological: Positive for headaches.    Social History  Substance Use Topics  . Smoking status: Never Smoker  . Smokeless tobacco: Current User    Types: Chew    Last attempt to quit: 04/13/2015  . Alcohol use Yes     Comment: once or twice a week-2 beers at that time     Objective:   BP 120/88 (BP Location: Right Arm, Patient Position: Sitting, Cuff Size: Large)   Pulse 84   Temp 98.1 F (36.7 C) (Oral)   Resp 16   Wt 192 lb (87.1 kg)   SpO2 98%   BMI 29.19 kg/m  Vitals:   04/05/17 1537  BP: 120/88  Pulse: 84  Resp: 16  Temp: 98.1 F (36.7 C)  TempSrc: Oral  SpO2: 98%  Weight: 192 lb (87.1 kg)     Physical Exam  Constitutional: He appears well-developed and well-nourished.  HENT:  Head: Normocephalic.  Right Ear: Tympanic membrane normal.  Left Ear: Tympanic membrane normal.  Mouth/Throat: Oropharynx is clear and moist.  Eyes: Conjunctivae are normal. Pupils are equal, round, and reactive to light. Right eye exhibits no discharge. Left eye exhibits no discharge.  Neck: Normal range of motion. Neck supple. No thyromegaly present.  Cardiovascular: Normal rate, regular rhythm and normal heart sounds.   Pulmonary/Chest:  Mild rhonchi in bilateral lung bases.  Lymphadenopathy:    He has no cervical adenopathy.        Assessment & Plan:     1. Mixed simple and mucopurulent chronic bronchitis (HCC) Treat with abx as below. Delsym/Mucinex for cough. Call if sx worsen or fail to improve. - azithromycin (ZITHROMAX) 250 MG tablet; Take 2 tablets on the first day, and 1 tablet each remaining day until gone.  Dispense: 6 tablet; Refill: 0     Patient seen and examined  by Miguel Aschoff, MD, and note scribed by Renaldo Fiddler, CMA. I have done the exam and reviewed the above chart and it is accurate to the best of my knowledge. Development worker, community has been used in this note in any air is in the dictation or transcription are unintentional.  Wilhemena Durie, MD  [place]

## 2017-04-05 NOTE — Patient Instructions (Signed)
Try an OTC expectorant cough syrup for cough relief. Push fluids and get plenty of rest. Call if your symptoms do not improve after the antibiotics are finished.

## 2018-01-19 ENCOUNTER — Other Ambulatory Visit: Payer: Self-pay | Admitting: Family Medicine

## 2018-01-19 ENCOUNTER — Telehealth: Payer: Self-pay | Admitting: Family Medicine

## 2018-01-19 ENCOUNTER — Encounter: Payer: Self-pay | Admitting: Family Medicine

## 2018-01-19 ENCOUNTER — Ambulatory Visit: Payer: Managed Care, Other (non HMO) | Admitting: Family Medicine

## 2018-01-19 VITALS — BP 106/78 | HR 93 | Temp 97.4°F | Resp 16 | Wt 192.0 lb

## 2018-01-19 DIAGNOSIS — N411 Chronic prostatitis: Secondary | ICD-10-CM

## 2018-01-19 DIAGNOSIS — Z8249 Family history of ischemic heart disease and other diseases of the circulatory system: Secondary | ICD-10-CM | POA: Diagnosis not present

## 2018-01-19 DIAGNOSIS — I10 Essential (primary) hypertension: Secondary | ICD-10-CM

## 2018-01-19 MED ORDER — DOXYCYCLINE HYCLATE 100 MG PO TABS: 100.0000 mg | ORAL_TABLET | Freq: Two times a day (BID) | ORAL | 2 refills | Status: DC

## 2018-01-19 MED ORDER — LISINOPRIL-HYDROCHLOROTHIAZIDE 20-12.5 MG PO TABS: 0.5000 | ORAL_TABLET | Freq: Every day | ORAL | 3 refills | Status: DC

## 2018-01-19 NOTE — Progress Notes (Signed)
       Patient: Joe PandyGary D Mcguffin Male    DOB: [birth year]   42 y.o.   MRN: [id number] Visit Date: 01/19/2018  Today's Provider: Megan Mansichard Gilbert Jr, MD   Chief Complaint  Patient presents with  . Hypertension  . Testicle Pain   Subjective:    HPI   Pt states he [facility] who started pt on lisinopril-hctz 20-12.5 mg 0.5 tab po qd for HTN. Pt states he tolerates medication well, and is controlling BP well. He no longer wishes to see Dr. Hyacinth MeekerMiller, and would like this medication to be managed by [facility].  Pt's main complaint today is possible prostatitis. Sx have been occurring x 1 week. Sx include bilateral groin pain, dysuria, testicular pain, and failure of erection. He has had prostatitis twice before, and states sx are similar.  No Known Allergies   Current Outpatient Medications:  .  atorvastatin (LIPITOR) 10 MG tablet, take 1 tablet by mouth at bedtime, Disp: 90 tablet, Rfl: 12 .  lisinopril-hydrochlorothiazide (PRINZIDE,ZESTORETIC) 20-12.5 MG tablet, Take 0.5 tablets by mouth daily., Disp: , Rfl:   Review of Systems  Constitutional: Positive for fatigue. Negative for activity change, appetite change, chills, diaphoresis, fever and unexpected weight change.  Cardiovascular: Negative for chest pain, palpitations and leg swelling.  Endocrine: Negative.   Genitourinary: Positive for dysuria and testicular pain. Negative for discharge, hematuria and scrotal swelling.  Allergic/Immunologic: Negative.   Psychiatric/Behavioral: Negative.     Social History   Tobacco Use  . Smoking status: Never Smoker  . Smokeless tobacco: Current User    Types: Chew  Substance Use Topics  . Alcohol use: Yes    Comment: once or twice a week-2 beers at that time   Objective:   BP 106/78 (BP Location: Left Arm, Patient Position: Sitting, Cuff Size: Large)   Pulse 93   Temp (!) 97.4 F (36.3 C) (Oral)   Resp 16   Wt 192 lb (87.1 kg)   SpO2 98%   BMI 29.19 kg/m    Vitals:   01/19/18 1115  BP: 106/78  Pulse: 93  Resp: 16  Temp: (!) 97.4 F (36.3 C)  TempSrc: Oral  SpO2: 98%  Weight: 192 lb (87.1 kg)     Physical Exam  Constitutional: He is oriented to person, place, and time. He appears well-developed and well-nourished.  HENT:  Head: Normocephalic and atraumatic.  Eyes: Conjunctivae are normal.  Neck: No thyromegaly present.  Cardiovascular: Normal rate, regular rhythm and normal heart sounds.  Pulmonary/Chest: Effort normal and breath sounds normal.  Abdominal: Soft.  Neurological: He is alert and oriented to person, place, and time.  Skin: Skin is warm and dry.  Psychiatric: He has a normal mood and affect. His behavior is normal. Judgment and thought content normal.        Assessment & Plan:     1. Essential hypertension  - lisinopril-hydrochlorothiazide (PRINZIDE,ZESTORETIC) 20-12.5 MG tablet; Take 0.5 tablets by mouth daily.  Dispense: 90 tablet; Refill: 3  2. Chronic prostatitis  - doxycycline (VIBRA-TABS) 100 MG tablet; Take 1 tablet (100 mg total) by mouth 2 (two) times daily.  Dispense: 20 tablet; Refill: 2  3. Family history of abdominal aortic aneurysm - US Abdomen Complete; Future       Richard Wendelyn BreslowGilbert Jr, MD  [facility]

## 2018-01-19 NOTE — Telephone Encounter (Signed)
[facility] states for diagnosis given test should be ultrasound Aorta IVC/iliacs doppler limited [id number]

## 2018-01-19 NOTE — Telephone Encounter (Signed)
Done

## 2018-01-23 ENCOUNTER — Telehealth: Payer: Self-pay | Admitting: Family Medicine

## 2018-01-23 DIAGNOSIS — Z8249 Family history of ischemic heart disease and other diseases of the circulatory system: Secondary | ICD-10-CM

## 2018-01-23 NOTE — Telephone Encounter (Signed)
[facility] who states correct test is US triple AAA duplex limited

## 2018-01-23 NOTE — Telephone Encounter (Signed)
Order placed

## 2018-01-27 ENCOUNTER — Other Ambulatory Visit: Payer: Self-pay | Admitting: Family Medicine

## 2018-01-30 ENCOUNTER — Telehealth: Payer: Self-pay

## 2018-01-30 ENCOUNTER — Ambulatory Visit
Admission: RE | Admit: 2018-01-30 | Discharge: 2018-01-30 | Disposition: A | Discharge status: Home / Self Care | Payer: 59 | Source: Ambulatory Visit | Attending: Family Medicine | Admitting: Family Medicine

## 2018-01-30 DIAGNOSIS — Z8249 Family history of ischemic heart disease and other diseases of the circulatory system: Secondary | ICD-10-CM | POA: Insufficient documentation

## 2018-01-30 NOTE — Telephone Encounter (Signed)
-----   Message from Maple Hudsonichard L Gilbert Jr., MD sent at 01/30/2018  3:16 PM EST ----- Normal aortic US,

## 2018-01-30 NOTE — Telephone Encounter (Signed)
Patient advised.KW 

## 2018-02-08 ENCOUNTER — Other Ambulatory Visit: Payer: Self-pay | Admitting: Family Medicine

## 2018-07-08 IMAGING — US US AORTA
1 series · 14 of 25 positions shown · non-contrast
Comparison: None.

CLINICAL DATA: Family history abdominal aortic aneurysm

EXAM:
ULTRASOUND OF ABDOMINAL AORTA
TECHNIQUE: Ultrasound examination of the abdominal aorta was performed to
evaluate for abdominal aortic aneurysm.

[Series 1: us aorta · 0.34mm/px · 14 of 26 slices shown]
[im 1/26]
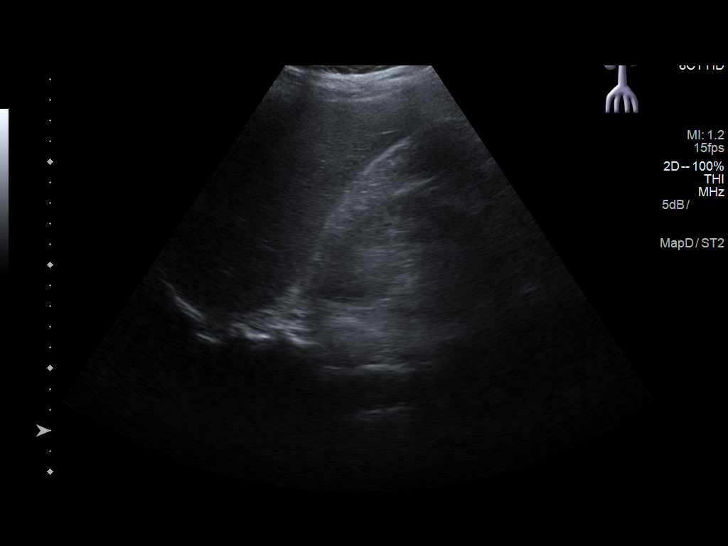
[im 3/26]
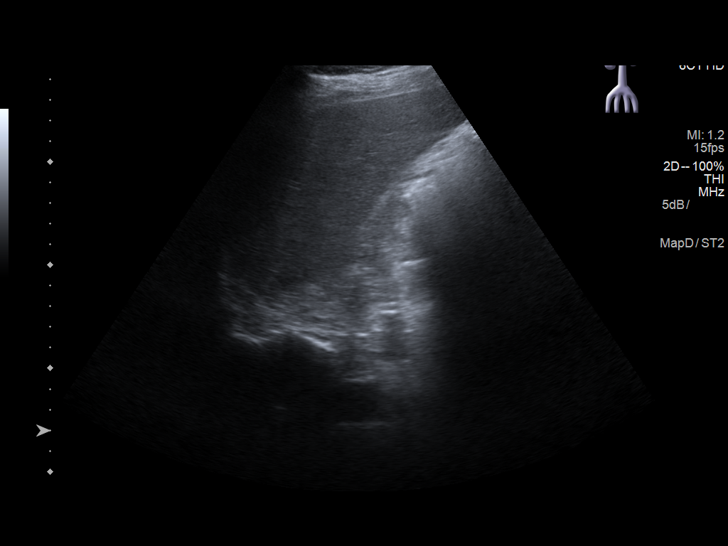
[im 5/26]
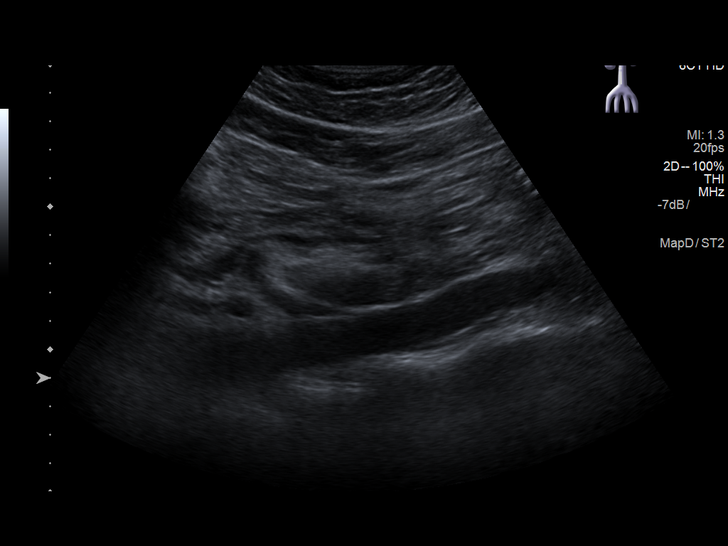
[im 7/26]
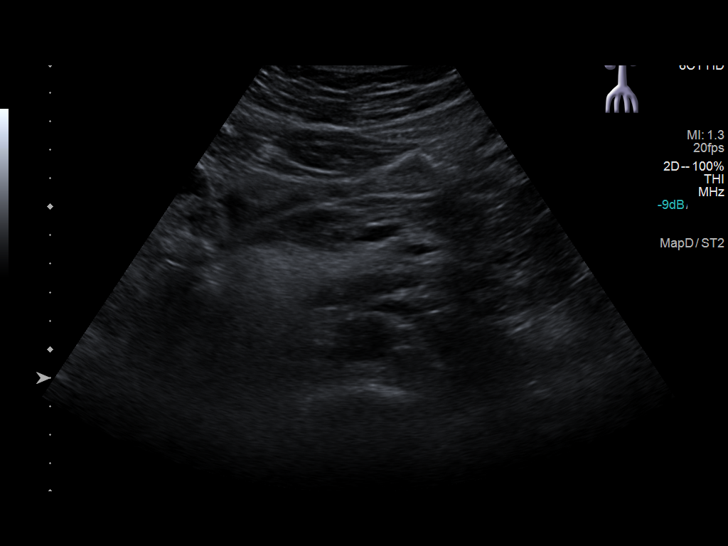
[im 9/26]
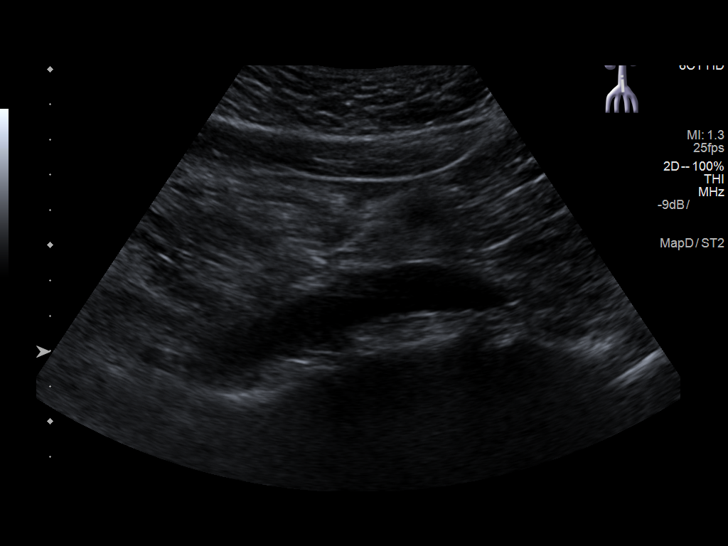
[im 10/26]
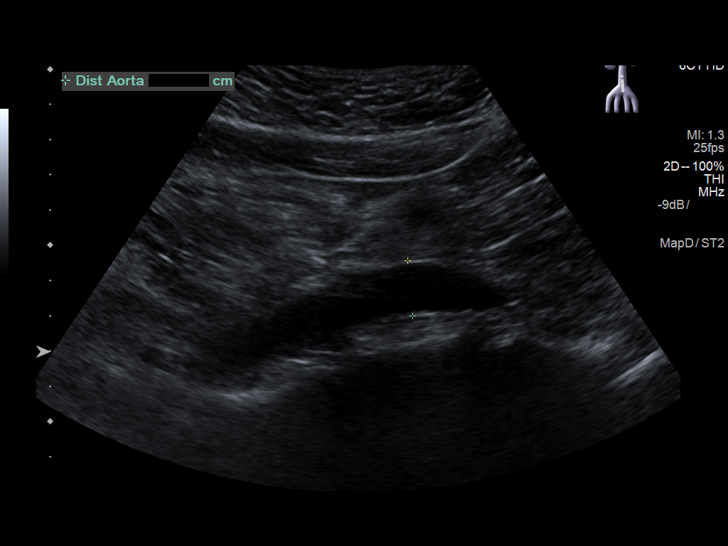
[im 12/26]
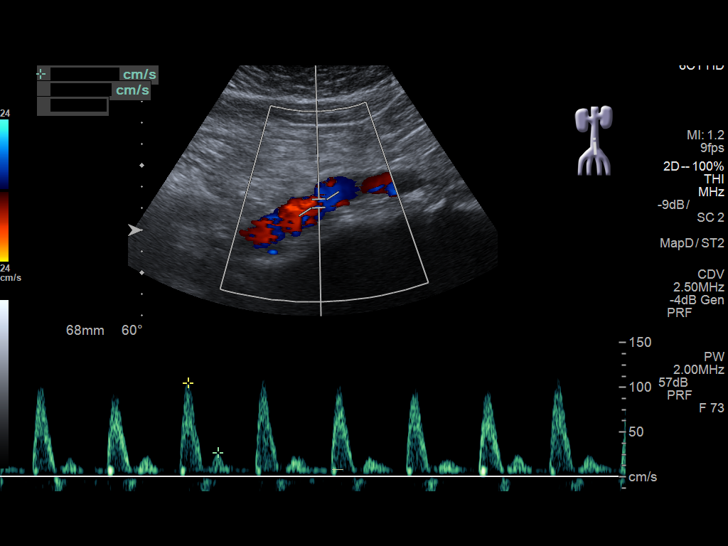
[im 14/26]
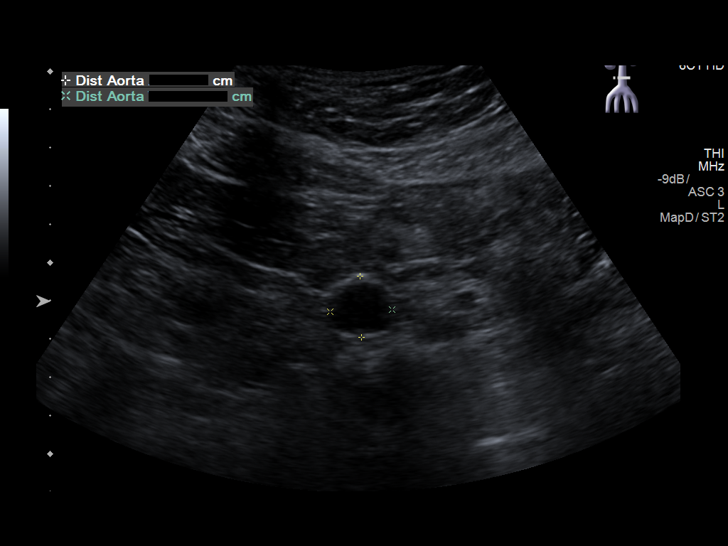
[im 16/26]
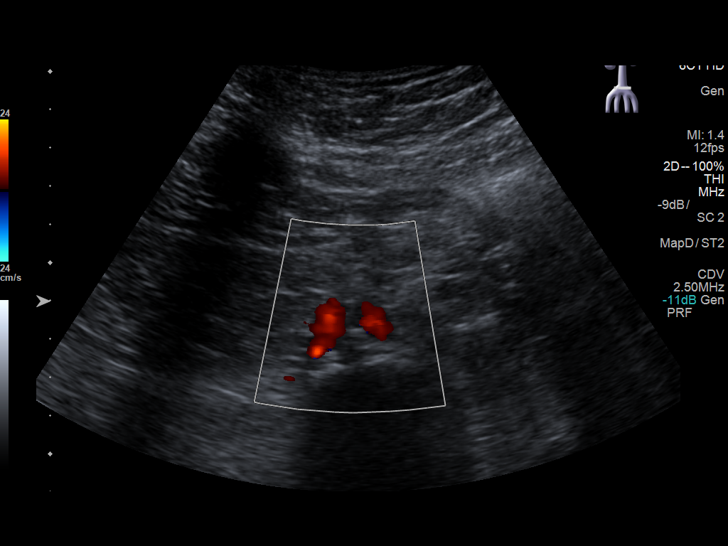
[im 17/26]
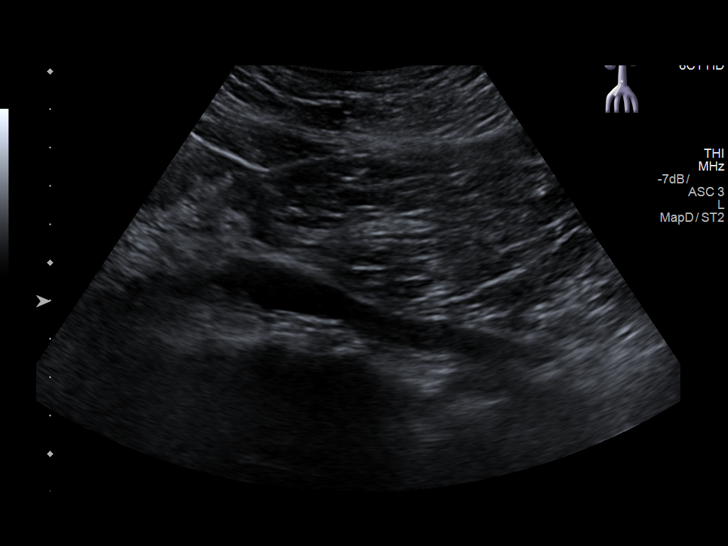
[im 19/26]
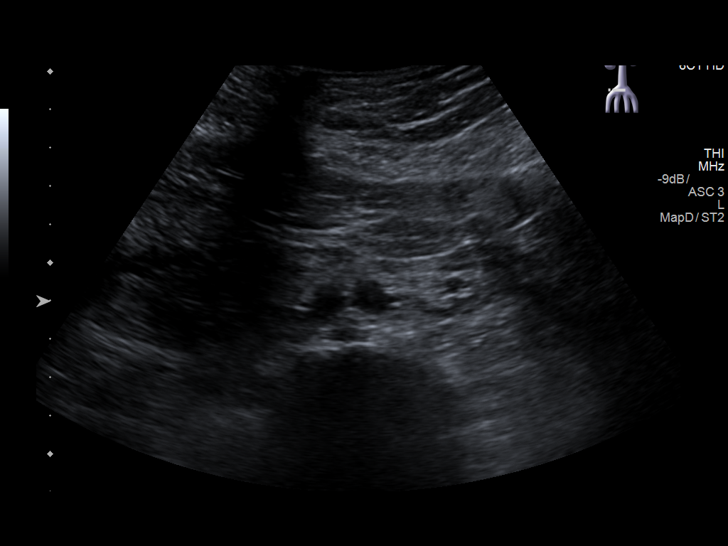
[im 21/26]
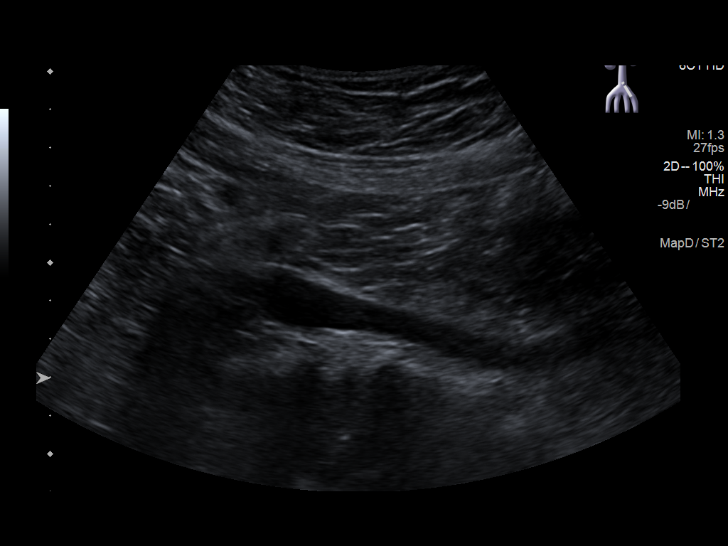
[im 23/26]
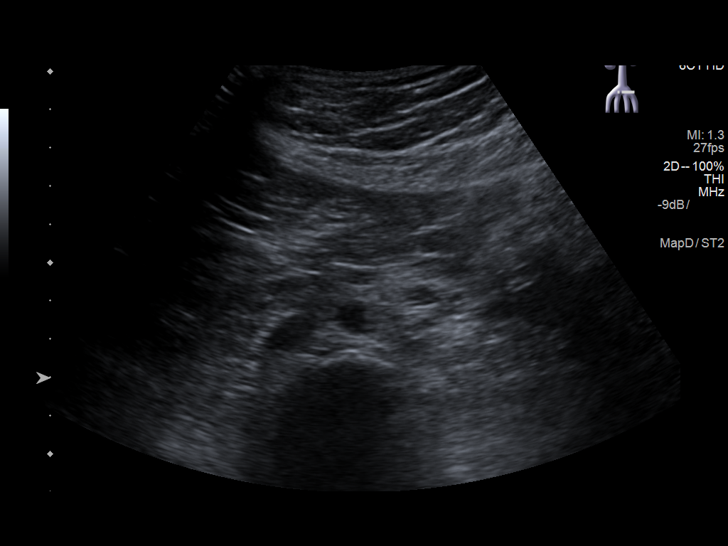
[im 26/26]
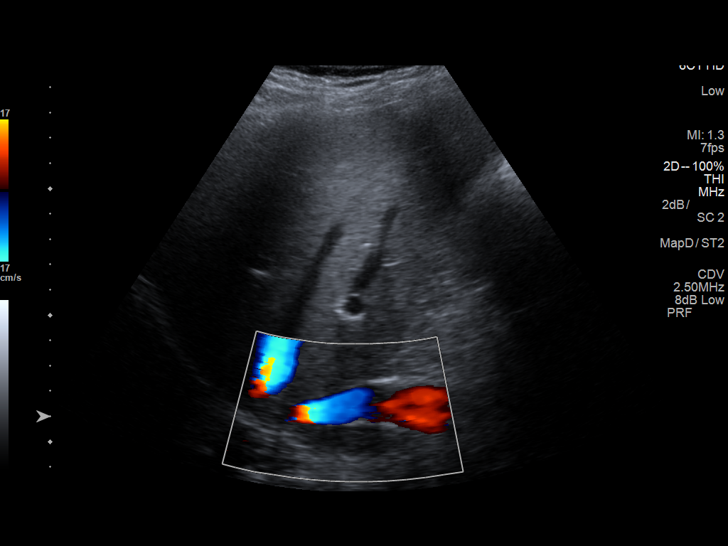

[14 of 25 positions shown; findings below may reference images not displayed]

FINDINGS: Abdominal aortic measurements as follows:

Proximal:  2.5 cm

Mid:  1.8 cm

Distal:  1.6 cm
IMPRESSION: Negative for abdominal aortic aneurysm.

## 2018-12-14 ENCOUNTER — Telehealth: Payer: Self-pay

## 2018-12-14 NOTE — Telephone Encounter (Signed)
Patient requesting copy of sleep studies done in 2017 and 2019.  He is seeing specialist and needs to have copies.   Call back number is [phone]

## 2018-12-19 NOTE — Telephone Encounter (Signed)
Spoke with pt. Pt is requesting to pick up his sleep study results from 02/02/2017 & US Aorta Complete from 01/30/2018. Both have been printed and placed up front for pick-up. Pt was advised he will need to sign a release form that was placed with the documents. Thanks TNP

## 2019-03-26 ENCOUNTER — Other Ambulatory Visit: Payer: Self-pay | Admitting: Family Medicine

## 2019-04-02 ENCOUNTER — Other Ambulatory Visit: Payer: Self-pay | Admitting: Family Medicine

## 2019-04-02 DIAGNOSIS — I10 Essential (primary) hypertension: Secondary | ICD-10-CM

## 2019-05-10 ENCOUNTER — Other Ambulatory Visit: Payer: Self-pay

## 2019-05-10 ENCOUNTER — Emergency Department (HOSPITAL_COMMUNITY): Payer: Managed Care, Other (non HMO)

## 2019-05-10 ENCOUNTER — Emergency Department (HOSPITAL_COMMUNITY)
Admission: EM | Admit: 2019-05-10 | Discharge: 2019-05-10 | Disposition: A | Discharge status: Home / Self Care | Payer: Managed Care, Other (non HMO) | Attending: Emergency Medicine | Admitting: Emergency Medicine

## 2019-05-10 ENCOUNTER — Encounter (HOSPITAL_COMMUNITY): Payer: Self-pay

## 2019-05-10 ENCOUNTER — Other Ambulatory Visit: Payer: Self-pay | Admitting: Family Medicine

## 2019-05-10 DIAGNOSIS — R1031 Right lower quadrant pain: Secondary | ICD-10-CM | POA: Insufficient documentation

## 2019-05-10 DIAGNOSIS — I10 Essential (primary) hypertension: Secondary | ICD-10-CM | POA: Insufficient documentation

## 2019-05-10 DIAGNOSIS — Z79899 Other long term (current) drug therapy: Secondary | ICD-10-CM | POA: Diagnosis not present

## 2019-05-10 DIAGNOSIS — N411 Chronic prostatitis: Secondary | ICD-10-CM

## 2019-05-10 DIAGNOSIS — R109 Unspecified abdominal pain: Secondary | ICD-10-CM

## 2019-05-10 HISTORY — DX: Essential (primary) hypertension: I10

## 2019-05-10 HISTORY — DX: Hyperlipidemia, unspecified: E78.5

## 2019-05-10 HISTORY — DX: Calculus of kidney: N20.0

## 2019-05-10 LAB — CBC WITH DIFFERENTIAL/PLATELET
Abs Immature Granulocytes: 0.04 10*3/uL (ref 0.00–0.07)
Basophils Absolute: 0 10*3/uL (ref 0.0–0.1)
Basophils Relative: 0 %
Eosinophils Absolute: 0.2 10*3/uL (ref 0.0–0.5)
Eosinophils Relative: 2 %
HCT: 48.5 % (ref 39.0–52.0)
Hemoglobin: 16.3 g/dL (ref 13.0–17.0)
Immature Granulocytes: 0 %
Lymphocytes Relative: 21 %
Lymphs Abs: 2.2 10*3/uL (ref 0.7–4.0)
MCH: 29.6 pg (ref 26.0–34.0)
MCHC: 33.6 g/dL (ref 30.0–36.0)
MCV: 88.2 fL (ref 80.0–100.0)
Monocytes Absolute: 0.6 10*3/uL (ref 0.1–1.0)
Monocytes Relative: 6 %
Neutro Abs: 7.6 10*3/uL (ref 1.7–7.7)
Neutrophils Relative %: 71 %
Platelets: 238 10*3/uL (ref 150–400)
RBC: 5.5 MIL/uL (ref 4.22–5.81)
RDW: 11.5 % (ref 11.5–15.5)
WBC: 10.7 10*3/uL — ABNORMAL HIGH (ref 4.0–10.5)
nRBC: 0 % (ref 0.0–0.2)

## 2019-05-10 LAB — URINALYSIS, ROUTINE W REFLEX MICROSCOPIC
Bacteria, UA: NONE SEEN
Bilirubin Urine: NEGATIVE
Glucose, UA: NEGATIVE mg/dL
Ketones, ur: 5 mg/dL — AB
Leukocytes,Ua: NEGATIVE
Nitrite: NEGATIVE
Protein, ur: 30 mg/dL — AB
RBC / HPF: >50 RBC/hpf — ABNORMAL HIGH (ref 0–5)
Specific Gravity, Urine: 1.019 (ref 1.005–1.030)
pH: 6 (ref 5.0–8.0)

## 2019-05-10 LAB — COMPREHENSIVE METABOLIC PANEL WITH GFR
ALT: 28 U/L (ref 0–44)
AST: 24 U/L (ref 15–41)
Albumin: 5.2 g/dL — ABNORMAL HIGH (ref 3.5–5.0)
Alkaline Phosphatase: 54 U/L (ref 38–126)
Anion gap: 13 (ref 5–15)
BUN: 21 mg/dL — ABNORMAL HIGH (ref 6–20)
CO2: 24 mmol/L (ref 22–32)
Calcium: 9.8 mg/dL (ref 8.9–10.3)
Chloride: 99 mmol/L (ref 98–111)
Creatinine, Ser: 1.41 mg/dL — ABNORMAL HIGH (ref 0.61–1.24)
GFR calc Af Amer: >60 mL/min
GFR calc non Af Amer: >60 mL/min
Glucose, Bld: 108 mg/dL — ABNORMAL HIGH (ref 70–99)
Potassium: 4 mmol/L (ref 3.5–5.1)
Sodium: 136 mmol/L (ref 135–145)
Total Bilirubin: 1.1 mg/dL (ref 0.3–1.2)
Total Protein: 7.9 g/dL (ref 6.5–8.1)

## 2019-05-10 LAB — LIPASE, BLOOD: Lipase: 49 U/L (ref 11–51)

## 2019-05-10 MED ORDER — FENTANYL CITRATE (PF) 100 MCG/2ML IJ SOLN
50.0000 ug | Freq: Once | INTRAMUSCULAR | Status: AC
  Filled 2019-05-10: qty 2

## 2019-05-10 MED ORDER — SODIUM CHLORIDE (PF) 0.9 % IJ SOLN
INTRAMUSCULAR | Status: AC
  Filled 2019-05-10: qty 50

## 2019-05-10 MED ORDER — IOHEXOL 300 MG/ML  SOLN: 100.0000 mL | Freq: Once | INTRAMUSCULAR | Status: DC | PRN

## 2019-05-10 MED ORDER — SODIUM CHLORIDE 0.9 % IV BOLUS
1000.0000 mL | Freq: Once | INTRAVENOUS | Status: AC
  Administered 2019-05-10: 16:00:00 1000 mL via INTRAVENOUS

## 2019-05-10 MED ORDER — ONDANSETRON HCL 4 MG/2ML IJ SOLN
4.0000 mg | Freq: Once | INTRAMUSCULAR | Status: AC
  Filled 2019-05-10: qty 2

## 2019-05-10 NOTE — ED Triage Notes (Signed)
Pt c/o RLQ pain that travels through to groin- began today

## 2019-05-10 NOTE — ED Notes (Signed)
Pt called out, states that he just passed a kidney stone, and now is ready to go home. Provider notified

## 2019-05-10 NOTE — ED Provider Notes (Signed)
[facility]-EMERGENCY DEPT Provider Note   CSN: [id number] Arrival date & time: 05/10/19  1443    History   Chief Complaint Chief Complaint  Patient presents with  . Abdominal Pain    HPI Joe Burns is a 43 y.o. male.     The history is provided by the patient.  Abdominal Pain  Pain location:  RLQ Pain quality: aching and dull   Pain radiates to:  Does not radiate Pain severity:  Mild Onset quality:  Sudden Duration:  2 hours Timing:  Intermittent Progression:  Waxing and waning Chronicity:  New Context comment:  Hx of kidney stones Relieved by:  Nothing Worsened by:  Nothing Associated symptoms: no anorexia, no belching, no chest pain, no chills, no cough, no diarrhea, no dysuria, no fever, no hematuria, no nausea, no shortness of breath, no sore throat and no vomiting   Risk factors: has not had multiple surgeries     Past Medical History:  Diagnosis Date  . Hyperlipidemia   . Hypertension   . Kidney stones     Patient Active Problem List   Diagnosis Date Noted  . Clinical depression 05/11/2015  . Blood pressure elevated 05/11/2015  . Hypercholesteremia 12/29/2009  . Avitaminosis D 12/29/2009  . History of tobacco use 10/10/2009    Past Surgical History:  Procedure Laterality Date  . KNEE SURGERY Right   . MOHS SURGERY          Home Medications    Prior to Admission medications   Medication Sig Start Date End Date Taking? Authorizing Provider  atorvastatin (LIPITOR) 10 MG tablet TAKE 1 TABLET BY MOUTH EVERY NIGHT AT BEDTIME 03/26/19   Maple Hudson., MD  doxycycline (VIBRA-TABS) 100 MG tablet Take 1 tablet (100 mg total) by mouth 2 (two) times daily. 01/19/18   Maple Hudson., MD  lisinopril-hydrochlorothiazide (ZESTORETIC) 20-12.5 MG tablet TAKE 1/2 TABLET BY MOUTH DAILY 04/02/19   Maple Hudson., MD    Family History Family History  Problem Relation Age of Onset  . Hyperlipidemia Mother   .  Aneurysm Mother        AORTIC ABDOMINAL-REPAIRED  . Heart attack Father        IN HIS 50S    Social History Social History   Tobacco Use  . Smoking status: Never Smoker  . Smokeless tobacco: Current User    Types: Chew  Substance Use Topics  . Alcohol use: Yes    Comment: once or twice a week-2 beers at that time  . Drug use: No     Allergies   Patient has no known allergies.   Review of Systems Review of Systems  Constitutional: Negative for chills and fever.  HENT: Negative for ear pain and sore throat.   Eyes: Negative for pain and visual disturbance.  Respiratory: Negative for cough and shortness of breath.   Cardiovascular: Negative for chest pain and palpitations.  Gastrointestinal: Positive for abdominal pain. Negative for anorexia, diarrhea, nausea and vomiting.  Genitourinary: Negative for dysuria and hematuria.  Musculoskeletal: Negative for arthralgias and back pain.  Skin: Negative for color change and rash.  Neurological: Negative for seizures and syncope.  All other systems reviewed and are negative.    Physical Exam Updated Vital Signs  ED Triage Vitals [05/10/19 1452]  Enc Vitals Group     BP (!) 154/85     Pulse Rate 86     Resp 20     Temp  98 F (36.7 C)     Temp Source Oral     SpO2 100 %     Weight      Height      Head Circumference      Peak Flow      Pain Score      Pain Loc      Pain Edu?      Excl. in GC?     Physical Exam Vitals signs and nursing note reviewed.  Constitutional:      Appearance: He is well-developed.  HENT:     Head: Normocephalic and atraumatic.  Eyes:     Conjunctiva/sclera: Conjunctivae normal.  Neck:     Musculoskeletal: Neck supple.  Cardiovascular:     Rate and Rhythm: Normal rate and regular rhythm.     Heart sounds: Normal heart sounds. No murmur.  Pulmonary:     Effort: Pulmonary effort is normal. No respiratory distress.     Breath sounds: Normal breath sounds.  Abdominal:      Palpations: Abdomen is soft.     Tenderness: There is abdominal tenderness in the right lower quadrant. There is no right CVA tenderness, left CVA tenderness, guarding or rebound. Negative signs include Murphy's sign and Rovsing's sign.  Skin:    General: Skin is warm and dry.     Capillary Refill: Capillary refill takes less than 2 seconds.  Neurological:     Mental Status: He is alert.      ED Treatments / Results  Labs (all labs ordered are listed, but only abnormal results are displayed) Labs Reviewed  CBC WITH DIFFERENTIAL/PLATELET - Abnormal; Notable for the following components:      Result Value   WBC 10.7 (*)    All other components within normal limits  COMPREHENSIVE METABOLIC PANEL - Abnormal; Notable for the following components:   Glucose, Bld 108 (*)    BUN 21 (*)    Creatinine, Ser 1.41 (*)    Albumin 5.2 (*)    All other components within normal limits  URINALYSIS, ROUTINE W REFLEX MICROSCOPIC - Abnormal; Notable for the following components:   APPearance HAZY (*)    Hgb urine dipstick LARGE (*)    Ketones, ur 5 (*)    Protein, ur 30 (*)    RBC / HPF >50 (*)    All other components within normal limits  LIPASE, BLOOD    EKG None  Radiology No results found.  Procedures Procedures (including critical care time)  Medications Ordered in ED Medications  fentaNYL (SUBLIMAZE) injection 50 mcg (0 mcg Intravenous Return to [facility] 05/10/19 1540)  ondansetron (ZOFRAN) injection 4 mg (0 mg Intravenous Return to [facility] 05/10/19 1540)  sodium chloride 0.9 % bolus 1,000 mL (1,000 mLs Intravenous New Bag/Given 05/10/19 1540)     Initial Impression / Assessment and Plan / ED Course  I have reviewed the triage vital signs and the nursing notes.  Pertinent labs & imaging results that were available during my care of the patient were reviewed by me and considered in my medical decision making (see chart for details).     Joe Burns is a 43 year old male with  history of kidney stones who presents to the ED with right lower quadrant abdominal pain.  Patient with normal vitals.  No fever.  Patient states that pain came on suddenly about an hour or 2 ago.  Has subsided.  Has noticed some pink-tinged urine.  Had some flank pain last week.  Patient is tender in the right lower quadrant.  Concern for kidney stone versus appendicitis.  Denies any urinary symptoms.  No concern for STDs.  No testicular pain or swelling.  Will get basic labs, CT abdomen and pelvis.  Will give IV fluids, IV pain meds, IV nausea medicine.  Will reevaluate.  Patient with no signs of urinary tract infection.  Did have large blood in his urine.  Otherwise no significant anemia, leukocytosis.  Patient states that he passed kidney stone while awaiting CT scan.  No longer has any pain.  Suspect that his pain was from kidney stone and not appendicitis.  Repeat abdominal exam is unremarkable.  We discussed that he could have had both a kidney stone and possible appendicitis but at this time.  Decision was made not to do CT scan and suspect that symptoms today were from kidney stone.  He understands return precautions and discharged from ED in good condition.  This chart was dictated using voice recognition software.  Despite best efforts to proofread,  errors can occur which can change the documentation meaning.    Final Clinical Impressions(s) / ED Diagnoses   Final diagnoses:  Abdominal pain, unspecified abdominal location    ED Discharge Orders    None       Virgina NorfolkCuratolo, Soriya Worster, DO 05/10/19 1629

## 2019-05-10 NOTE — ED Notes (Signed)
Bed: MB55 Expected date:  Expected time:  Means of arrival:  Comments: Hold for [facility]

## 2019-06-17 ENCOUNTER — Other Ambulatory Visit: Payer: Self-pay | Admitting: Family Medicine

## 2019-06-18 NOTE — Telephone Encounter (Signed)
Please advise refill? 

## 2019-06-27 ENCOUNTER — Other Ambulatory Visit: Payer: Self-pay | Admitting: Family Medicine

## 2019-06-27 DIAGNOSIS — I10 Essential (primary) hypertension: Secondary | ICD-10-CM

## 2019-07-02 ENCOUNTER — Other Ambulatory Visit: Payer: Self-pay | Admitting: Family Medicine

## 2019-07-02 DIAGNOSIS — I10 Essential (primary) hypertension: Secondary | ICD-10-CM

## 2019-07-02 NOTE — Telephone Encounter (Signed)
Please review. LOV 01/2018. Thanks!

## 2019-07-02 NOTE — Telephone Encounter (Signed)
Pt needs a refill on   Atorvastatin  Lisinopril  [facility]  CB#  [phone]  teri

## 2019-07-03 ENCOUNTER — Other Ambulatory Visit: Payer: Self-pay | Admitting: Family Medicine

## 2019-07-03 MED ORDER — ATORVASTATIN CALCIUM 10 MG PO TABS: 10.0000 mg | ORAL_TABLET | Freq: Every day | ORAL | 0 refills | Status: DC

## 2019-07-03 MED ORDER — LISINOPRIL-HYDROCHLOROTHIAZIDE 20-12.5 MG PO TABS: 0.5000 | ORAL_TABLET | Freq: Every day | ORAL | 0 refills | Status: DC

## 2019-09-26 ENCOUNTER — Other Ambulatory Visit: Payer: Self-pay | Admitting: Family Medicine

## 2019-09-26 DIAGNOSIS — I10 Essential (primary) hypertension: Secondary | ICD-10-CM

## 2019-09-27 NOTE — Telephone Encounter (Signed)
Please advise refill? Patient has not been seen since 01/19/2018.

## 2022-03-18 ENCOUNTER — Ambulatory Visit
Admission: RE | Admit: 2022-03-18 | Discharge: 2022-03-18 | Disposition: A | Discharge status: Home / Self Care | Payer: Self-pay | Attending: Physician Assistant | Admitting: Physician Assistant

## 2022-03-18 ENCOUNTER — Other Ambulatory Visit: Payer: Self-pay | Admitting: Physician Assistant

## 2022-03-18 ENCOUNTER — Ambulatory Visit
Admission: RE | Admit: 2022-03-18 | Discharge: 2022-03-18 | Disposition: A | Discharge status: Home / Self Care | Payer: Self-pay | Source: Ambulatory Visit | Attending: Physician Assistant | Admitting: Physician Assistant

## 2022-03-18 DIAGNOSIS — Z0000 Encounter for general adult medical examination without abnormal findings: Secondary | ICD-10-CM

## 2022-05-25 IMAGING — CR DG CHEST 1V
1 series · 1 of 1 positions shown · non-contrast
Comparison: None.

CLINICAL DATA: Annual wellness check.  Firefighter.

EXAM:
CHEST  1 VIEW

[dg chest 1 view]
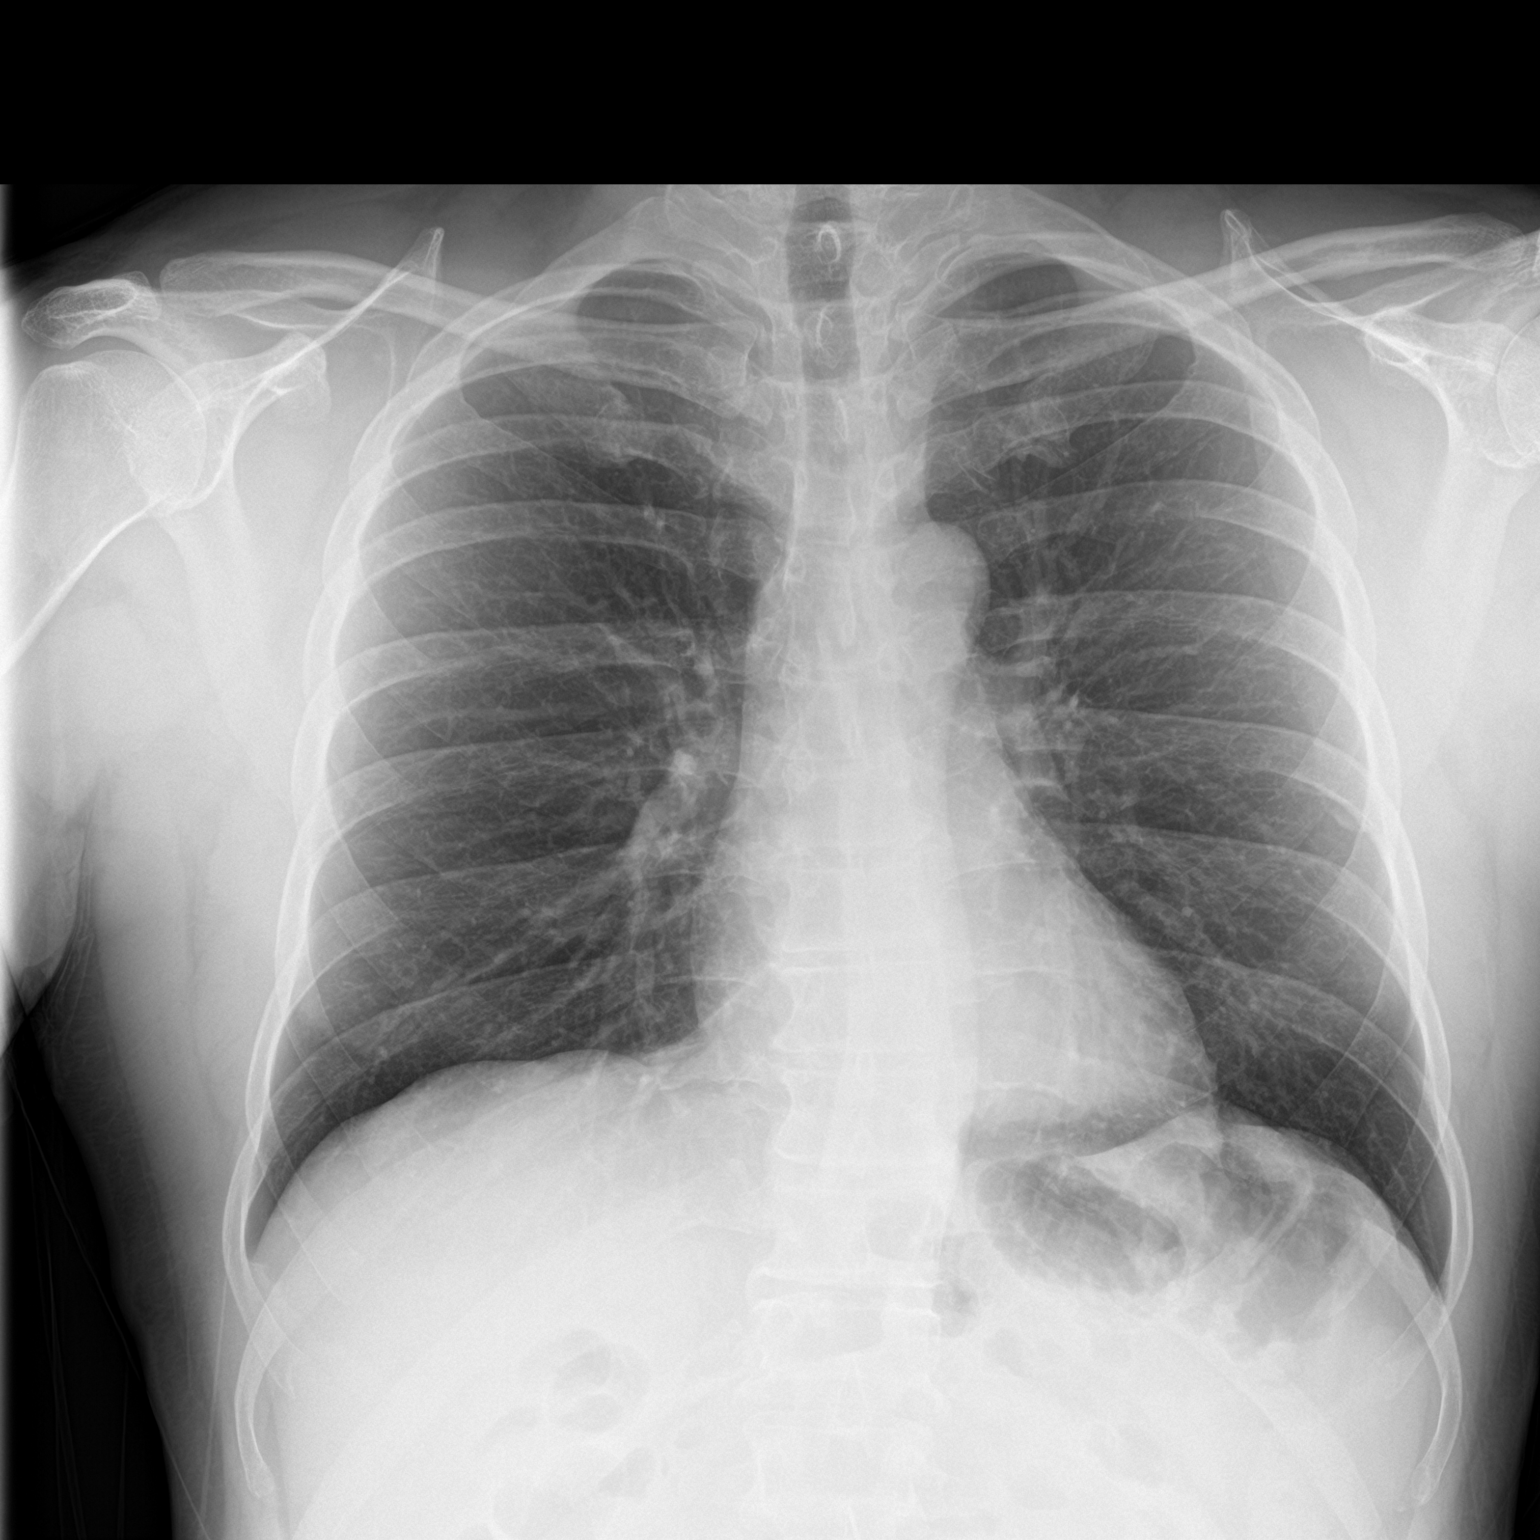

[1 of 1 positions shown; findings below may reference images not displayed]

FINDINGS: The lungs are clear without focal pneumonia, edema, pneumothorax or
pleural effusion. The cardiopericardial silhouette is within normal
limits for size. The visualized bony structures of the thorax are
unremarkable.
IMPRESSION: No active disease.

## 2022-07-30 ENCOUNTER — Other Ambulatory Visit: Payer: Self-pay | Admitting: Registered Nurse

## 2022-07-30 DIAGNOSIS — Z8249 Family history of ischemic heart disease and other diseases of the circulatory system: Secondary | ICD-10-CM

## 2022-08-06 ENCOUNTER — Ambulatory Visit
Admission: RE | Admit: 2022-08-06 | Discharge: 2022-08-06 | Disposition: A | Discharge status: Home / Self Care | Payer: No Typology Code available for payment source | Source: Ambulatory Visit | Attending: Registered Nurse | Admitting: Registered Nurse

## 2022-08-06 DIAGNOSIS — Z8249 Family history of ischemic heart disease and other diseases of the circulatory system: Secondary | ICD-10-CM

## 2023-08-31 DIAGNOSIS — F411 Generalized anxiety disorder: Secondary | ICD-10-CM | POA: Diagnosis not present

## 2023-09-06 DIAGNOSIS — D2262 Melanocytic nevi of left upper limb, including shoulder: Secondary | ICD-10-CM | POA: Diagnosis not present

## 2023-09-06 DIAGNOSIS — L218 Other seborrheic dermatitis: Secondary | ICD-10-CM | POA: Diagnosis not present

## 2023-09-06 DIAGNOSIS — D2272 Melanocytic nevi of left lower limb, including hip: Secondary | ICD-10-CM | POA: Diagnosis not present

## 2023-09-06 DIAGNOSIS — D2271 Melanocytic nevi of right lower limb, including hip: Secondary | ICD-10-CM | POA: Diagnosis not present

## 2023-09-29 DIAGNOSIS — F411 Generalized anxiety disorder: Secondary | ICD-10-CM | POA: Diagnosis not present

## 2023-10-21 NOTE — Progress Notes (Unsigned)
New patient visit   Patient: Joe Burns   DOB: [birth year]   47 y.o. Male  MRN: [id number] Visit Date: 10/25/2023  Today's healthcare provider: Ronnald Ramp, MD   No chief complaint on file.  Subjective    Joe Burns is a 47 y.o. male who presents today as a new patient to establish care.      Discussed the use of AI scribe software for clinical note transcription with the patient, who gave verbal consent to proceed.  History of Present Illness              Past Medical History:  Diagnosis Date   Hyperlipidemia    Hypertension    Kidney stones     Outpatient Medications Prior to Visit  Medication Sig   atorvastatin (LIPITOR) 10 MG tablet TAKE 1 TABLET BY MOUTH EVERYDAY AT BEDTIME   doxycycline (VIBRA-TABS) 100 MG tablet Take 1 tablet (100 mg total) by mouth 2 (two) times daily.   lisinopril-hydrochlorothiazide (ZESTORETIC) 20-12.5 MG tablet TAKE 1/2 TABLET BY MOUTH EVERY DAY   No facility-administered medications prior to visit.    Past Surgical History:  Procedure Laterality Date   KNEE SURGERY Right    MOHS SURGERY     Family Status  Relation Name Status   Mother  Alive   Father  Alive   Sister  Alive   Sister  Alive  No partnership data on file   Family History  Problem Relation Age of Onset   Hyperlipidemia Mother    Aneurysm Mother        AORTIC ABDOMINAL-REPAIRED   Heart attack Father        IN HIS 50S   Social History   Socioeconomic History   Marital status: Married    Spouse name: Tresa Endo   Number of children: 0   Years of education: 12   Highest education level: Not on file  Occupational History   Occupation: [employer] technologist  Tobacco Use   Smoking status: Never   Smokeless tobacco: Current    Types: Chew  Substance and Sexual Activity   Alcohol use: Yes    Comment: once or twice a week-2 beers at that time   Drug use: No   Sexual activity: Yes  Other Topics Concern   Not on file  Social History  Narrative   Not on file   Social Determinants of Health   Financial Resource Strain: Not on file  Food Insecurity: Not on file  Transportation Needs: Not on file  Physical Activity: Not on file  Stress: Not on file  Social Connections: Not on file     No Known Allergies  Immunization History  Administered Date(s) Administered   Tdap 09/24/2009    Health Maintenance  Topic Date Due   COVID-19 Vaccine (1) Never done   HIV Screening  Never done   Hepatitis C Screening  Never done   Colonoscopy  Never done   DTaP/Tdap/Td (2 - Td or Tdap) 09/25/2019   INFLUENZA VACCINE  07/14/2023   HPV VACCINES  Aged Out    Patient Care Team: Bosie Clos, MD as PCP - General (Family Medicine)  Review of Systems  {Insert previous labs (optional):23779} {See past labs  Heme  Chem  Endocrine  Serology  Results Review (optional):1}   Objective    There were no vitals taken for this visit. {Insert last BP/Wt (optional):23777}{See vitals history (optional):1}    Depression Screen    01/19/2018  11:24 AM 11/18/2016   11:55 AM 08/18/2016    8:27 AM  PHQ 2/9 Scores  PHQ - 2 Score 0 1 3  PHQ- 9 Score 3 4 10    No results found for any visits on 10/25/23.   Physical Exam ***    Assessment & Plan      Problem List Items Addressed This Visit   None   Assessment and Plan               No follow-ups on file.      Ronnald Ramp, MD  [facility] [phone] (phone) [phone] (fax)  [facility]

## 2023-10-25 ENCOUNTER — Encounter: Payer: Self-pay | Admitting: Family Medicine

## 2023-10-25 ENCOUNTER — Ambulatory Visit: Payer: BC Managed Care – PPO | Admitting: Family Medicine

## 2023-10-25 VITALS — BP 138/82 | HR 93 | Resp 18 | Ht 68.0 in | Wt 179.2 lb

## 2023-10-25 DIAGNOSIS — R7989 Other specified abnormal findings of blood chemistry: Secondary | ICD-10-CM

## 2023-10-25 DIAGNOSIS — Z79890 Hormone replacement therapy (postmenopausal): Secondary | ICD-10-CM

## 2023-10-25 DIAGNOSIS — D582 Other hemoglobinopathies: Secondary | ICD-10-CM

## 2023-10-25 DIAGNOSIS — E7800 Pure hypercholesterolemia, unspecified: Secondary | ICD-10-CM | POA: Diagnosis not present

## 2023-10-25 DIAGNOSIS — E291 Testicular hypofunction: Secondary | ICD-10-CM | POA: Diagnosis not present

## 2023-10-25 DIAGNOSIS — F900 Attention-deficit hyperactivity disorder, predominantly inattentive type: Secondary | ICD-10-CM | POA: Insufficient documentation

## 2023-10-25 DIAGNOSIS — E559 Vitamin D deficiency, unspecified: Secondary | ICD-10-CM

## 2023-10-25 DIAGNOSIS — N411 Chronic prostatitis: Secondary | ICD-10-CM | POA: Insufficient documentation

## 2023-10-25 DIAGNOSIS — Z720 Tobacco use: Secondary | ICD-10-CM | POA: Diagnosis not present

## 2023-10-25 DIAGNOSIS — E875 Hyperkalemia: Secondary | ICD-10-CM

## 2023-10-25 DIAGNOSIS — I10 Essential (primary) hypertension: Secondary | ICD-10-CM | POA: Diagnosis not present

## 2023-10-25 MED ORDER — DOXYCYCLINE HYCLATE 100 MG PO TABS: 100.0000 mg | ORAL_TABLET | Freq: Two times a day (BID) | ORAL | 0 refills | Status: DC

## 2023-10-25 MED ORDER — LISINOPRIL-HYDROCHLOROTHIAZIDE 20-12.5 MG PO TABS: 1.0000 | ORAL_TABLET | Freq: Every day | ORAL | 0 refills | Status: DC

## 2023-10-25 NOTE — Assessment & Plan Note (Signed)
Hx of vitamin D deficiency  Pt not currently on supplementation

## 2023-10-25 NOTE — Assessment & Plan Note (Signed)
Chronic  Pre-contemplative stage  Counseled on recommendations for cessation for 5 mins  Patient not interested in quitting at this time

## 2023-10-25 NOTE — Assessment & Plan Note (Signed)
Current blood pressure 155/107 mmHg. Non-compliance with antihypertensive regimen. Previous readings: 132/83 mmHg (2020), 106/78 mmHg (2019). No adverse effects from lisinopril and hydrochlorothiazide. Discussed risks of uncontrolled hypertension (stroke, heart attack) and benefits of resuming regimen and home monitoring. Chronic  - Recheck blood pressure - Recommend home blood pressure monitoring - Resume lisinopril 20 mg and hydrochlorothiazide 12.5 mg - Order CMP and thyroid studies

## 2023-10-25 NOTE — Assessment & Plan Note (Signed)
Chronic problem Managed with Lipitor 10 mg daily. Last LDL 121 mg/dL (4259). Recent non-compliance. Discussed risks of untreated hyperlipidemia (cardiovascular disease) and benefits of resuming Lipitor. - Recheck CMP & lipid panel  - Resume Lipitor 10 mg daily

## 2023-10-25 NOTE — Assessment & Plan Note (Addendum)
Self-administering testosterone without prescription (0.5 cc every 1.5 weeks). Previous management by endocrinologist and nurse practitioner. Reports feeling better with current dose. Discussed risks of unsupervised use (cardiovascular events, prostate issues) and benefits of urology referral. - Refer to urology for testosterone management -ordered CBC today

## 2023-10-25 NOTE — Patient Instructions (Signed)
VISIT SUMMARY:  [name], during your visit today, we discussed your history of hypertension, hypercholesterolemia, ADHD, and other health concerns. We reviewed your current medications and addressed your recent discontinuation of some treatments. We also talked about your lifestyle, including your use of smokeless tobacco and alcohol intake. We have made several recommendations to help manage your health conditions effectively.  YOUR PLAN:  -HYPERTENSION: Hypertension, or high blood pressure, can lead to serious health problems like stroke and heart attack if not managed properly. Your blood pressure today was 155/107 mmHg. We recommend you resume taking Lisinopril 20 mg and Hydrochlorothiazide 12.5 mg daily, monitor your blood pressure at home, and we will recheck it during your next visit. We also ordered some blood tests to check your overall health.  -HYPERLIPIDEMIA: Hyperlipidemia means having high levels of cholesterol in your blood, which can increase your risk of heart disease. You should resume taking Lipitor 10 mg daily to help manage your cholesterol levels. We will also recheck your blood work to monitor your progress.  -PROSTATITIS: Prostatitis is an inflammation of the prostate gland that can cause pain and urinary issues. You have had recurrent prostatitis, and we recommend treating it with doxycycline and referring you to a urologist for further management. We also ordered a PSA test to check your prostate health.  -TESTOSTERONE REPLACEMENT THERAPY: You are currently self-administering testosterone, which can have risks if not supervised by a doctor. We recommend seeing a urologist to manage your testosterone therapy safely.  -ADHD: ADHD, or Attention Deficit Hyperactivity Disorder, affects your ability to focus and can cause anxiety. You are currently taking Adderall 20 mg daily, which has been helping. Continue taking it as prescribed by Dr. Margaret Pyle at [facility].  -GENERAL HEALTH MAINTENANCE: Your BMI is 27.25, and you use smokeless tobacco, which can cause oral cancer and gum disease. We encourage you to quit using smokeless tobacco. You have reduced your alcohol intake, which is good. Please schedule a physical exam before the end of the year or early next year to review your overall health.  INSTRUCTIONS:  Please follow up in 2-3 weeks to recheck your blood pressure and see how you are responding to the medications. We will send your lab results via MyChart. If there are any changes needed based on your lab results, we will call you.

## 2023-10-25 NOTE — Assessment & Plan Note (Addendum)
Chronic  Recurrent prostatitis treated with doxycycline. Current symptoms: intermittent ache, weak stream. Discussed risks of untreated prostatitis (chronic pain, urinary issues) and benefits of treatment and urology referral. - Order PSA - Treat with doxycycline

## 2023-10-25 NOTE — Assessment & Plan Note (Signed)
Chronic, relatively recent diagnosis  Managed with Adderall 20 mg daily. Reports improvement in anxiety and concentration. Under care of Dr. Margaret Pyle at [facility]. Discussed risks of Adderall (dependency, cardiovascular effects) and benefits of continued use. - Continue Adderall 20 mg daily as needed

## 2023-10-26 LAB — CMP14+EGFR
ALT: 19 [IU]/L (ref 0–44)
AST: 22 [IU]/L (ref 0–40)
Albumin: 5 g/dL (ref 4.1–5.1)
Alkaline Phosphatase: 66 [IU]/L (ref 44–121)
BUN/Creatinine Ratio: 6 — ABNORMAL LOW (ref 9–20)
BUN: 9 mg/dL (ref 6–24)
Bilirubin Total: 0.5 mg/dL (ref 0.0–1.2)
CO2: 24 mmol/L (ref 20–29)
Calcium: 10.3 mg/dL — ABNORMAL HIGH (ref 8.7–10.2)
Chloride: 100 mmol/L (ref 96–106)
Creatinine, Ser: 1.44 mg/dL — ABNORMAL HIGH (ref 0.76–1.27)
Globulin, Total: 2.4 g/dL (ref 1.5–4.5)
Glucose: 93 mg/dL (ref 70–99)
Potassium: 5.3 mmol/L — ABNORMAL HIGH (ref 3.5–5.2)
Sodium: 140 mmol/L (ref 134–144)
Total Protein: 7.4 g/dL (ref 6.0–8.5)
eGFR: 60 mL/min/{1.73_m2} (ref >59)

## 2023-10-26 LAB — CBC
Hematocrit: 56.3 % — ABNORMAL HIGH (ref 37.5–51.0)
Hemoglobin: 19.3 g/dL — ABNORMAL HIGH (ref 13.0–17.7)
MCH: 29.8 pg (ref 26.6–33.0)
MCHC: 34.3 g/dL (ref 31.5–35.7)
MCV: 87 fL (ref 79–97)
Platelets: 289 10*3/uL (ref 150–450)
RBC: 6.47 x10E6/uL — ABNORMAL HIGH (ref 4.14–5.80)
RDW: 13 % (ref 11.6–15.4)
WBC: 6.6 10*3/uL (ref 3.4–10.8)

## 2023-10-26 LAB — PSA TOTAL (REFLEX TO FREE): Prostate Specific Ag, Serum: 1.7 ng/mL (ref 0.0–4.0)

## 2023-10-26 LAB — TSH+T4F+T3FREE
Free T4: 1.16 ng/dL (ref 0.82–1.77)
T3, Free: 3.5 pg/mL (ref 2.0–4.4)
TSH: 4.75 u[IU]/mL — ABNORMAL HIGH (ref 0.450–4.500)

## 2023-10-26 LAB — LIPID PANEL
Chol/HDL Ratio: 5.6 ratio — ABNORMAL HIGH (ref 0.0–5.0)
Cholesterol, Total: 263 mg/dL — ABNORMAL HIGH (ref 100–199)
HDL: 47 mg/dL (ref >39)
LDL Chol Calc (NIH): 188 mg/dL — ABNORMAL HIGH (ref 0–99)
Triglycerides: 151 mg/dL — ABNORMAL HIGH (ref 0–149)
VLDL Cholesterol Cal: 28 mg/dL (ref 5–40)

## 2023-10-27 NOTE — Addendum Note (Signed)
Addended by: Bing Neighbors on: 10/27/2023 08:08 AM   Modules accepted: Orders

## 2023-10-31 DIAGNOSIS — F411 Generalized anxiety disorder: Secondary | ICD-10-CM | POA: Diagnosis not present

## 2023-11-07 NOTE — Progress Notes (Unsigned)
      Established patient visit   Patient: Joe Burns   DOB: 1976/03/11   47 y.o. Male  MRN: [id number] Visit Date: 11/08/2023  Today's healthcare provider: Ronnald Ramp, MD   No chief complaint on file.  Subjective       Discussed the use of AI scribe software for clinical note transcription with the patient, who gave verbal consent to proceed.  History of Present Illness             Past Medical History:  Diagnosis Date   Anxiety 2013   Currently taking  Adderall.  This has curbed 80-90% of the anxiety.   Arthritis 2010   Hands and knees   Cancer (HCC) 2009   Melanoma on my back.   Depression 2010   No longer a problem   Hyperlipidemia    Hypertension    Kidney stones     Medications: Outpatient Medications Prior to Visit  Medication Sig   amphetamine-dextroamphetamine (ADDERALL XR) 20 MG 24 hr capsule Take 20 mg by mouth daily.   atorvastatin (LIPITOR) 10 MG tablet TAKE 1 TABLET BY MOUTH EVERYDAY AT BEDTIME (Patient not taking: Reported on 10/25/2023)   doxycycline (VIBRA-TABS) 100 MG tablet Take 1 tablet (100 mg total) by mouth 2 (two) times daily.   lisinopril-hydrochlorothiazide (ZESTORETIC) 20-12.5 MG tablet Take 1 tablet by mouth daily.   No facility-administered medications prior to visit.    Review of Systems  Last CBC Lab Results  Component Value Date   WBC 6.6 10/25/2023   HGB 19.3 (H) 10/25/2023   HCT 56.3 (H) 10/25/2023   MCV 87 10/25/2023   MCH 29.8 10/25/2023   RDW 13.0 10/25/2023   PLT 289 10/25/2023   Last metabolic panel Lab Results  Component Value Date   GLUCOSE 93 10/25/2023   NA 140 10/25/2023   K 5.3 (H) 10/25/2023   CL 100 10/25/2023   CO2 24 10/25/2023   BUN 9 10/25/2023   CREATININE 1.44 (H) 10/25/2023   EGFR 60 10/25/2023   CALCIUM 10.3 (H) 10/25/2023   PROT 7.4 10/25/2023   ALBUMIN 5.0 10/25/2023   LABGLOB 2.4 10/25/2023   AGRATIO 1.9 11/18/2016   BILITOT 0.5 10/25/2023   ALKPHOS 66 10/25/2023    AST 22 10/25/2023   ALT 19 10/25/2023   ANIONGAP 13 05/10/2019     {See past labs  Heme  Chem  Endocrine  Serology  Results Review (optional):1}   Objective    There were no vitals taken for this visit. BP Readings from Last 3 Encounters:  10/25/23 138/82  05/10/19 132/83  01/19/18 106/78   Wt Readings from Last 3 Encounters:  10/25/23 179 lb 3.2 oz (81.3 kg)  01/19/18 192 lb (87.1 kg)  04/05/17 192 lb (87.1 kg)    {See vitals history (optional):1}    Physical Exam  ***  No results found for any visits on 11/08/23.  Assessment & Plan     Problem List Items Addressed This Visit   None   Assessment and Plan           ***CBC &CMP ordered    No follow-ups on file.         Ronnald Ramp, MD  [facility] [phone] (phone) [phone] (fax)  [facility]

## 2023-11-08 ENCOUNTER — Encounter: Payer: Self-pay | Admitting: Family Medicine

## 2023-11-08 ENCOUNTER — Ambulatory Visit: Payer: BC Managed Care – PPO | Admitting: Family Medicine

## 2023-11-08 VITALS — BP 125/87 | HR 83 | Resp 18 | Ht 68.0 in | Wt 173.0 lb

## 2023-11-08 DIAGNOSIS — E7800 Pure hypercholesterolemia, unspecified: Secondary | ICD-10-CM

## 2023-11-08 DIAGNOSIS — N411 Chronic prostatitis: Secondary | ICD-10-CM

## 2023-11-08 DIAGNOSIS — I10 Essential (primary) hypertension: Secondary | ICD-10-CM

## 2023-11-08 DIAGNOSIS — E291 Testicular hypofunction: Secondary | ICD-10-CM | POA: Diagnosis not present

## 2023-11-08 DIAGNOSIS — E875 Hyperkalemia: Secondary | ICD-10-CM

## 2023-11-08 DIAGNOSIS — F900 Attention-deficit hyperactivity disorder, predominantly inattentive type: Secondary | ICD-10-CM

## 2023-11-08 NOTE — Assessment & Plan Note (Signed)
Managed by endocrinologist. Currently on testosterone 0.5 mL every 1.5 weeks. PSA within normal limits. Elevated hemoglobin of 19 likely due to testosterone treatment. Appointment with urology on Dec 11th. - Follow up with urology on Dec 11th with Dr. Lonna Cobb - Check testosterone levels today and hgb per pt request in prep for urology appt, states his last testosterone injection was early Nov 2024

## 2023-11-08 NOTE — Assessment & Plan Note (Signed)
Managed with doxycycline 100 mg. Symptoms include weak urine stream and testicular ache. Improvement noted with doxycycline. - continue doxycycline 100 mg - Follow up with urology on Dec 11th

## 2023-11-08 NOTE — Assessment & Plan Note (Signed)
Chronic condition with LDL of 188 and total cholesterol of 263. ASCVD risk score is 6.6%. Recommended dietary modification and increased exercise. - Resume Lipitor 10 mg daily - Recommend dietary modification and increased exercise

## 2023-11-08 NOTE — Assessment & Plan Note (Signed)
Managed by Dr. Allyne Gee at [facility]. Currently on Adderall 20 mg daily. Noted weight loss of 17 lbs over the last two months. Discussed potential need to adjust Adderall dosage if weight loss continues. - Continue Adderall 20 mg daily - Follow up with Dr. Allyne Gee in two months

## 2023-11-08 NOTE — Assessment & Plan Note (Signed)
Chronic condition, previously stated goal of <130/80 mmHg. BP today is 125/87 mmHg, improved from 138/82 mmHg on Nov 12th. Restarted on lisinopril 20 mg and hydrochlorothiazide 12.5 mg. Discussed potential impact of Adderall on BP. Weight loss of 6 lbs since last visit. - Recheck BMP today for elevated potassium and creatinine during last visit  - Continue lisinopril 20 mg and hydrochlorothiazide 12.5 mg

## 2023-11-10 LAB — BMP8+EGFR
BUN/Creatinine Ratio: 12 (ref 9–20)
BUN: 16 mg/dL (ref 6–24)
CO2: 23 mmol/L (ref 20–29)
Calcium: 9.9 mg/dL (ref 8.7–10.2)
Chloride: 99 mmol/L (ref 96–106)
Creatinine, Ser: 1.34 mg/dL — ABNORMAL HIGH (ref 0.76–1.27)
Glucose: 97 mg/dL (ref 70–99)
Potassium: 4.7 mmol/L (ref 3.5–5.2)
Sodium: 139 mmol/L (ref 134–144)
eGFR: 65 mL/min/{1.73_m2} (ref >59)

## 2023-11-10 LAB — CBC
Hematocrit: 53 % — ABNORMAL HIGH (ref 37.5–51.0)
Hemoglobin: 17.9 g/dL — ABNORMAL HIGH (ref 13.0–17.7)
MCH: 29.6 pg (ref 26.6–33.0)
MCHC: 33.8 g/dL (ref 31.5–35.7)
MCV: 88 fL (ref 79–97)
Platelets: 252 10*3/uL (ref 150–450)
RBC: 6.05 x10E6/uL — ABNORMAL HIGH (ref 4.14–5.80)
RDW: 12.5 % (ref 11.6–15.4)
WBC: 6.1 10*3/uL (ref 3.4–10.8)

## 2023-11-10 LAB — TESTOSTERONE,FREE AND TOTAL
Testosterone, Free: 5.3 pg/mL — ABNORMAL LOW (ref 6.8–21.5)
Testosterone: 102 ng/dL — ABNORMAL LOW (ref 264–916)

## 2023-11-23 ENCOUNTER — Encounter: Payer: Self-pay | Admitting: Urology

## 2023-11-23 ENCOUNTER — Ambulatory Visit: Payer: BC Managed Care – PPO | Admitting: Urology

## 2023-11-23 VITALS — BP 135/85 | HR 91 | Ht 68.0 in | Wt 173.0 lb

## 2023-11-23 DIAGNOSIS — E291 Testicular hypofunction: Secondary | ICD-10-CM

## 2023-11-23 MED ORDER — ALFUZOSIN HCL ER 10 MG PO TB24: 10.0000 mg | ORAL_TABLET | Freq: Every day | ORAL | 3 refills | Status: DC

## 2023-11-23 NOTE — Progress Notes (Signed)
I, Maysun Anabel Bene, acting as a scribe for Riki Altes, MD., have documented all relevant documentation on the behalf of Riki Altes, MD, as directed by Riki Altes, MD while in the presence of Riki Altes, MD.  11/23/2023 5:00 PM   Estanislado Pandy Dec 10, 1976 [id number]  Referring provider: Ronnald Ramp, MD [address] [place],  [place] [address]  Chief Complaint  Patient presents with   Hypogonadism    HPI: Joe Burns is a 47 y.o. male referred for evaluation of hypogonadism.   Diagnosed with low testosterone around 2019 and was on TRT with his PCP at 200 mg every 2 weeks.  He was followed by an endocrinologist [facility], who he started seeing in June 2021. His primary symptoms were tiredness and fatigue. 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 He was using 100 mg weekly and states his dose was gradually decreased by his endocrinologist to where his testosterone levels were in the low 300 range and he didn't note symptom improvement and decided to discontinue the medication.  He was obtaining testosterone without physician monitoring, however stopped at approximately 1 month ago.  Testosterone level checked by his PCP 11/08/23 was low at 102 ng/dL. PSA was 1.7 and recent hemoglobin 19.3, which was repeated 2 weeks ago and was 17.9.   PMH: Past Medical History:  Diagnosis Date   Anxiety 2013   Currently taking  Adderall.  This has curbed 80-90% of the anxiety.   Arthritis 2010   Hands and knees   Cancer (HCC) 2009   Melanoma on my back.   Depression 2010   No longer a problem   Hyperlipidemia    Hypertension    Kidney stones     Surgical History: Past Surgical History:  Procedure Laterality Date   COSMETIC SURGERY  2009   Removal of basal cell on face   KNEE SURGERY Right    MOHS SURGERY      Home Medications:  Allergies as of 11/23/2023   No Known Allergies      Medication List         Accurate as of November 23, 2023  5:00 PM. If you have any questions, ask your nurse or doctor.          STOP taking these medications    doxycycline 100 MG tablet Commonly known as: VIBRA-TABS Stopped by: Riki Altes       TAKE these medications    alfuzosin 10 MG 24 hr tablet Commonly known as: UROXATRAL Take 1 tablet (10 mg total) by mouth daily with breakfast. Started by: Riki Altes   amphetamine-dextroamphetamine 20 MG 24 hr capsule Commonly known as: ADDERALL XR Take 20 mg by mouth daily.   atorvastatin 10 MG tablet Commonly known as: LIPITOR TAKE 1 TABLET BY MOUTH EVERYDAY AT BEDTIME   lisinopril-hydrochlorothiazide 20-12.5 MG tablet Commonly known as: ZESTORETIC Take 1 tablet by mouth daily.        Allergies: No Known Allergies  Family History: Family History  Problem Relation Age of Onset   Hyperlipidemia Mother    Aneurysm Mother        AORTIC ABDOMINAL-REPAIRED   Anxiety disorder Mother    Arthritis Mother    Depression Mother    Heart attack Father        IN HIS 47S   Alcohol abuse Father    Arthritis Father    Hearing loss Father  Heart disease Father    Hypertension Father    Heart disease Paternal Grandfather    Hypertension Paternal Grandfather    Diabetes Maternal Aunt    Drug abuse Paternal Uncle     Social History:  reports that he has never smoked. His smokeless tobacco use includes chew. He reports current alcohol use of about 11.0 standard drinks of alcohol per week. He reports that he does not use drugs.   Physical Exam: BP 135/85   Pulse 91   Ht 5\' 8"  (1.727 m)   Wt 173 lb (78.5 kg)   BMI 26.30 kg/m   Constitutional:  Alert and oriented, No acute distress. HEENT: [place] AT Respiratory: Normal respiratory effort, no increased work of breathing. Psychiatric: Normal mood and affect.   Assessment & Plan:    1. Hypogonadism Symptoms of tiredness/fatigue.  Will restart TRT at testosterone cypionate 100 mg  weekly.  Follow-up visit 3 months for H/H, testosterone level.  Recommend he donate blood regularly for his erythrocytosis. Once lab stable, will see annual office visits and Q6 month lab visit.   I have reviewed the above documentation for accuracy and completeness, and I agree with the above.   Riki Altes, MD  [facility] [address] [place], [place] [address] [phone]

## 2023-11-26 ENCOUNTER — Encounter: Payer: Self-pay | Admitting: Urology

## 2023-12-10 ENCOUNTER — Other Ambulatory Visit: Payer: Self-pay | Admitting: Family Medicine

## 2023-12-10 DIAGNOSIS — I10 Essential (primary) hypertension: Secondary | ICD-10-CM

## 2023-12-12 MED ORDER — LISINOPRIL-HYDROCHLOROTHIAZIDE 20-12.5 MG PO TABS: 1.0000 | ORAL_TABLET | Freq: Every day | ORAL | 0 refills | Status: DC

## 2024-01-02 DIAGNOSIS — F411 Generalized anxiety disorder: Secondary | ICD-10-CM | POA: Diagnosis not present

## 2024-01-13 ENCOUNTER — Other Ambulatory Visit: Payer: Self-pay | Admitting: Family Medicine

## 2024-01-13 MED ORDER — ATORVASTATIN CALCIUM 10 MG PO TABS: 10.0000 mg | ORAL_TABLET | Freq: Every day | ORAL | 1 refills | Status: DC

## 2024-01-13 NOTE — Telephone Encounter (Signed)
Requested Prescriptions  Pending Prescriptions Disp Refills   atorvastatin (LIPITOR) 10 MG tablet 90 tablet 2    Sig: Take 1 tablet (10 mg total) by mouth daily.     Cardiovascular:  Antilipid - Statins Failed - 01/13/2024  4:47 PM      Failed - Lipid Panel in normal range within the last 12 months    Cholesterol, Total  Date Value Ref Range Status  10/25/2023 263 (H) 100 - 199 mg/dL Final   LDL Chol Calc (NIH)  Date Value Ref Range Status  10/25/2023 188 (H) 0 - 99 mg/dL Final   HDL  Date Value Ref Range Status  10/25/2023 47 >39 mg/dL Final   Triglycerides  Date Value Ref Range Status  10/25/2023 151 (H) 0 - 149 mg/dL Final         Passed - Patient is not pregnant      Passed - Valid encounter within last 12 months    Recent Outpatient Visits           2 months ago Primary hypertension   [place] [facility] [place] Simmons-Robinson, Beat[place], MD   2 months ago Elevated hemoglobin (HCC)   [facility] Simmons-Robinson, [place], MD   5 years ago Essential hypertension   [place] [facility] [place] Hudson., MD   6 years ago Mixed simple and mucopurulent chronic bronchitis [facility])    [facility] [place] Hudson., MD   7 years ago Annual physical exam   [facility] [place] Hudson., MD       Future Appointments             In 3 months Simmons-Robinson, Tawanna Cooler, MD [facility]

## 2024-01-13 NOTE — Telephone Encounter (Signed)
Medication Refill -  Most Recent Primary Care Visit:  Provider: Ronnald Ramp  Department: [facility]  Visit Type: OFFICE VISIT  Date: 11/08/2023  Medication: atorvastatin (LIPITOR) 10 MG tablet [[id number]]   Has the patient contacted their pharmacy? Yes  (Agent: If yes, when and what did the pharmacy advise?) Contact office  Is this the correct pharmacy for this prescription? Yes  This is the patient's preferred pharmacy:    [id number] - [place], [place] - [address] [place] [address] Phone: [phone] Fax: [phone]   Has the prescription been filled recently? Yes  Is the patient out of the medication? Yes  Has the patient been seen for an appointment in the last year OR does the patient have an upcoming appointment? Yes  Can we respond through MyChart? Yes  Agent: Please be advised that Rx refills may take up to 3 business days. We ask that you follow-up with your pharmacy.

## 2024-02-22 ENCOUNTER — Other Ambulatory Visit: Payer: BC Managed Care – PPO

## 2024-02-22 DIAGNOSIS — E291 Testicular hypofunction: Secondary | ICD-10-CM | POA: Diagnosis not present

## 2024-02-23 ENCOUNTER — Encounter: Payer: Self-pay | Admitting: *Deleted

## 2024-02-23 LAB — HEMOGLOBIN AND HEMATOCRIT, BLOOD
Hematocrit: 51.1 % — ABNORMAL HIGH (ref 37.5–51.0)
Hemoglobin: 17.5 g/dL (ref 13.0–17.7)

## 2024-02-23 LAB — TESTOSTERONE: Testosterone: 562 ng/dL (ref 264–916)

## 2024-03-26 DIAGNOSIS — F411 Generalized anxiety disorder: Secondary | ICD-10-CM | POA: Diagnosis not present

## 2024-03-30 ENCOUNTER — Other Ambulatory Visit: Payer: Self-pay | Admitting: Family Medicine

## 2024-03-30 DIAGNOSIS — I10 Essential (primary) hypertension: Secondary | ICD-10-CM

## 2024-03-30 MED ORDER — LISINOPRIL-HYDROCHLOROTHIAZIDE 20-12.5 MG PO TABS: 1.0000 | ORAL_TABLET | Freq: Every day | ORAL | 0 refills | Status: DC

## 2024-05-10 ENCOUNTER — Encounter: Payer: Self-pay | Admitting: Family Medicine

## 2024-05-16 ENCOUNTER — Ambulatory Visit (INDEPENDENT_AMBULATORY_CARE_PROVIDER_SITE_OTHER): Admitting: Family Medicine

## 2024-05-16 VITALS — BP 126/85 | HR 68 | Ht 68.0 in | Wt 169.8 lb

## 2024-05-16 DIAGNOSIS — Z1211 Encounter for screening for malignant neoplasm of colon: Secondary | ICD-10-CM

## 2024-05-16 DIAGNOSIS — Z131 Encounter for screening for diabetes mellitus: Secondary | ICD-10-CM | POA: Diagnosis not present

## 2024-05-16 DIAGNOSIS — Z130 Encounter for screening for diseases of the blood and blood-forming organs and certain disorders involving the immune mechanism: Secondary | ICD-10-CM | POA: Diagnosis not present

## 2024-05-16 DIAGNOSIS — F900 Attention-deficit hyperactivity disorder, predominantly inattentive type: Secondary | ICD-10-CM

## 2024-05-16 DIAGNOSIS — E559 Vitamin D deficiency, unspecified: Secondary | ICD-10-CM | POA: Diagnosis not present

## 2024-05-16 DIAGNOSIS — I10 Essential (primary) hypertension: Secondary | ICD-10-CM | POA: Diagnosis not present

## 2024-05-16 DIAGNOSIS — Z1159 Encounter for screening for other viral diseases: Secondary | ICD-10-CM | POA: Diagnosis not present

## 2024-05-16 DIAGNOSIS — Z0000 Encounter for general adult medical examination without abnormal findings: Secondary | ICD-10-CM

## 2024-05-16 DIAGNOSIS — E7800 Pure hypercholesterolemia, unspecified: Secondary | ICD-10-CM

## 2024-05-16 DIAGNOSIS — Z114 Encounter for screening for human immunodeficiency virus [HIV]: Secondary | ICD-10-CM | POA: Diagnosis not present

## 2024-05-16 HISTORY — DX: Encounter for general adult medical examination without abnormal findings: Z00.00

## 2024-05-16 NOTE — Assessment & Plan Note (Signed)
 Chronic conditions are stable  Patient was counseled on benefits of regular physical activity with goal of 150 minutes of moderate to vigurous intensity 4 days per week  Patient was counseled to consume well balanced diet of fruits, vegetables, limited saturated fats and limited sugary foods and beverages with emphasis on consuming 6-8 glasses of water daily  Screening recommended today: A1c, lipids,CMP,CBC,Hep C,HIV and vitamin D levels   Colon cancer screening: reports completed cologuard in 2023 with NEG results,       Vaccines recommended today: COVID(pt declines)

## 2024-05-16 NOTE — Patient Instructions (Signed)
 It was a pleasure to see you today!  Thank you for choosing [facility] for your primary care.   Today you were seen for your annual physical  Please review the attached information regarding helpful preventive health topics.   To keep you healthy, please keep in mind the following health maintenance items that you are due for:   - Colon cancer screening   Recommended Vaccines  -Tetanus booster   Best Wishes,   Dr. Verdia Glad

## 2024-05-16 NOTE — Progress Notes (Signed)
 Complete physical exam   Patient: Joe Burns   DOB: [birth year]   48 y.o. Male  MRN: [id number] Visit Date: 05/16/2024  Today's healthcare provider: Mimi Alt, MD   Chief Complaint  Patient presents with   Annual Exam    Last completed 11/18/16 Diet -  General, well balanced  Exercise -  strenuous jobs as Barista company Feeling - well Sleeping - well Concerns - last 3 months when squatting and then stands up he has to stand for about 5-6 seconds to get his bearings before moving x daily.    Care Management    Cologuard - completed 2 years ago with pcp in [facility]   Subjective    Joe Burns is a 48 y.o. male who presents today for a complete physical exam.     Discussed the use of AI scribe software for clinical note transcription with the patient, who gave verbal consent to proceed.  History of Present Illness Joe Burns is a 48 year old male who presents for an annual physical exam.  He experiences dizziness lasting five to six seconds when transitioning from squatting to standing, which resolves once he feels balanced. Positional adjustments that were effective in high school are no longer helpful. He suspects inadequate hydration may contribute to his symptoms.  He takes medication for an enlarged prostate on an as-needed basis but is considering daily use due to increasing symptoms. He experiences decreased urinary pressure and prolonged urination when not on medication. He is uncertain about his next urologist appointment.  He is on testosterone  therapy, sourced from a non-pharmacy provider, taking approximately one CC weekly. He feels optimal when his testosterone  levels are between six to seven hundred. He has not donated blood in several months, a practice he usually follows to manage hemoglobin levels, and suspects they might be elevated.  He had a negative Cologuard test two years  ago. 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     Past Medical History:  Diagnosis Date   Annual physical exam 05/16/2024   Anxiety 2013   Currently taking  Adderall.  This has curbed 80-90% of the anxiety.   Arthritis 2010   Hands and knees   Cancer (HCC) 2009   Melanoma on my back.   Depression 2010   No longer a problem   Hyperlipidemia    Hypertension    Kidney stones    Past Surgical History:  Procedure Laterality Date   COSMETIC SURGERY  2009   Removal of basal cell on face   KNEE SURGERY Right    MOHS SURGERY     Social History   Socioeconomic History   Marital status: Married    Spouse name: Winton Offord   Number of children: 0   Years of education: 12   Highest education level: Associate degree: occupational, Scientist, product/process development, or vocational program  Occupational History   Occupation: Secretary/administrator  Tobacco Use   Smoking status: Never   Smokeless tobacco: Current    Types: Chew   Tobacco comments:    Pt reports that he sees dentist and has normal check ups so he has no interest in stopping chewing tobacco   Substance and Sexual Activity   Alcohol use: Yes    Alcohol/week: 11.0 standard drinks of alcohol    Types: 10 Cans of beer, 1 Shots of liquor  per week    Comment: once or twice a week-2 beers at that time   Drug use: No   Sexual activity: Yes    Birth control/protection: None  Other Topics Concern   Not on file  Social History Narrative   Not on file   Social Drivers of Health   Financial Resource Strain: Low Risk  (05/16/2024)   Overall Financial Resource Strain (CARDIA)    Difficulty of Paying Living Expenses: Not hard at all  Food Insecurity: No Food Insecurity (05/16/2024)   Hunger Vital Sign    Worried About Running Out of Food in the Last Year: Never true    Ran Out of Food in the Last Year: Never true  Transportation Needs: No Transportation Needs (05/16/2024)    PRAPARE - Administrator, Civil Service (Medical): No    Lack of Transportation (Non-Medical): No  Physical Activity: Unknown (05/16/2024)   Exercise Vital Sign    Days of Exercise per Week: 4 days    Minutes of Exercise per Session: Patient declined  Stress: Stress Concern Present (05/16/2024)   [employer] of Occupational Health - Occupational Stress Questionnaire    Feeling of Stress : To some extent  Social Connections: Moderately Isolated (05/16/2024)   Social Connection and Isolation Panel [NHANES]    Frequency of Communication with Friends and Family: More than three times a week    Frequency of Social Gatherings with Friends and Family: Once a week    Attends Religious Services: Never    Database administrator or Organizations: No    Attends Engineer, structural: Not on file    Marital Status: Married  Catering manager Violence: Not on file   Family Status  Relation Name Status   Mother [place] Alive   Father [place] Alive   Sister  Alive   Sister  Alive   PGF Publishing copy (Not Specified)   Mat Valetta Gaudy [employer] (Not Specified)   Ala Alice Eddie (Not Specified)  No partnership data on file   Family History  Problem Relation Age of Onset   Hyperlipidemia Mother    Aneurysm Mother        AORTIC ABDOMINAL-REPAIRED   Anxiety disorder Mother    Arthritis Mother    Depression Mother    Heart attack Father        IN HIS 50S   Alcohol abuse Father    Arthritis Father    Hearing loss Father    Heart disease Father    Hypertension Father    Heart disease Paternal Grandfather    Hypertension Paternal Grandfather    Diabetes Maternal Aunt    Drug abuse Paternal Uncle    No Known Allergies   Medications: Outpatient Medications Prior to Visit  Medication Sig   alfuzosin  (UROXATRAL ) 10 MG 24 hr tablet Take 1 tablet (10 mg total) by mouth daily with breakfast.   amphetamine-dextroamphetamine (ADDERALL XR) 20 MG 24 hr capsule Take 20 mg by mouth daily.    atorvastatin  (LIPITOR) 10 MG tablet Take 1 tablet (10 mg total) by mouth daily.   lisinopril -hydrochlorothiazide  (ZESTORETIC ) 20-12.5 MG tablet Take 1 tablet by mouth daily.   No facility-administered medications prior to visit.    Review of Systems  Last CBC Lab Results  Component Value Date   WBC 6.1 11/08/2023   HGB 17.5 02/22/2024   HCT 51.1 (H) 02/22/2024   MCV 88 11/08/2023   MCH 29.6 11/08/2023   RDW 12.5 11/08/2023   PLT  252 11/08/2023   Last metabolic panel Lab Results  Component Value Date   GLUCOSE 97 11/08/2023   NA 139 11/08/2023   K 4.7 11/08/2023   CL 99 11/08/2023   CO2 23 11/08/2023   BUN 16 11/08/2023   CREATININE 1.34 (H) 11/08/2023   EGFR 65 11/08/2023   CALCIUM  9.9 11/08/2023   PROT 7.4 10/25/2023   ALBUMIN 5.0 10/25/2023   LABGLOB 2.4 10/25/2023   AGRATIO 1.9 11/18/2016   BILITOT 0.5 10/25/2023   ALKPHOS 66 10/25/2023   AST 22 10/25/2023   ALT 19 10/25/2023   ANIONGAP 13 05/10/2019   Last lipids Lab Results  Component Value Date   CHOL 263 (H) 10/25/2023   HDL 47 10/25/2023   LDLCALC 188 (H) 10/25/2023   TRIG 151 (H) 10/25/2023   CHOLHDL 5.6 (H) 10/25/2023   Last hemoglobin A1c No results found for: "HGBA1C" Last thyroid  functions Lab Results  Component Value Date   TSH 4.750 (H) 10/25/2023   Last vitamin D No results found for: "25OHVITD2", "25OHVITD3", "VD25OH" Last vitamin B12 and Folate No results found for: "VITAMINB12", "FOLATE"    Objective    BP 126/85 (BP Location: Right Arm, Patient Position: Sitting, Cuff Size: Normal)   Pulse 68   Ht 5\' 8"  (1.727 m)   Wt 169 lb 12.8 oz (77 kg)   SpO2 100%   BMI 25.82 kg/m  BP Readings from Last 3 Encounters:  05/16/24 126/85  11/23/23 135/85  11/08/23 125/87   Wt Readings from Last 3 Encounters:  05/16/24 169 lb 12.8 oz (77 kg)  11/23/23 173 lb (78.5 kg)  11/08/23 173 lb (78.5 kg)       Physical Exam Constitutional:      General: He is not in acute distress.     Appearance: Normal appearance. He is not ill-appearing.  HENT:     Mouth/Throat:     Mouth: Mucous membranes are moist.  Eyes:     General: No scleral icterus.       Right eye: No discharge.        Left eye: No discharge.     Extraocular Movements: Extraocular movements intact.     Conjunctiva/sclera: Conjunctivae normal.     Pupils: Pupils are equal, round, and reactive to light.  Cardiovascular:     Rate and Rhythm: Normal rate and regular rhythm.     Heart sounds: Normal heart sounds.  Pulmonary:     Effort: Pulmonary effort is normal.     Breath sounds: Normal breath sounds.  Abdominal:     General: Bowel sounds are normal. There is no distension.     Palpations: Abdomen is soft.     Tenderness: There is no abdominal tenderness.  Musculoskeletal:        General: No deformity or signs of injury. Normal range of motion.     Cervical back: Normal range of motion and neck supple. No tenderness.     Right lower leg: No edema.     Left lower leg: No edema.  Lymphadenopathy:     Cervical: No cervical adenopathy.  Neurological:     Mental Status: He is alert and oriented to person, place, and time.     Cranial Nerves: No cranial nerve deficit.     Motor: No weakness.     Gait: Gait normal.  Psychiatric:        Mood and Affect: Mood normal.        Behavior: Behavior normal.  Last depression screening scores    11/08/2023   10:17 AM 10/25/2023    8:36 AM 01/19/2018   11:24 AM  PHQ 2/9 Scores  PHQ - 2 Score 0 0 0  PHQ- 9 Score 2 1 3     Last fall risk screening    11/08/2023   10:19 AM  Fall Risk   Falls in the past year? 0  Number falls in past yr: 0  Injury with Fall? 0    Last Audit-C alcohol use screening    05/16/2024    8:00 AM  Alcohol Use Disorder Test (AUDIT)  1. How often do you have a drink containing alcohol? 3  2. How many drinks containing alcohol do you have on a typical day when you are drinking? 0  3. How often do you have six or more  drinks on one occasion? 1  AUDIT-C Score 4   4. How often during the last year have you found that you were not able to stop drinking once you had started? 0  5. How often during the last year have you failed to do what was normally expected from you because of drinking? 0  6. How often during the last year have you needed a first drink in the morning to get yourself going after a heavy drinking session? 0  7. How often during the last year have you had a feeling of guilt of remorse after drinking? 0  8. How often during the last year have you been unable to remember what happened the night before because you had been drinking? 0  9. Have you or someone else been injured as a result of your drinking? 0  10. Has a relative or friend or a doctor or another health worker been concerned about your drinking or suggested you cut down? 0  Alcohol Use Disorder Identification Test Final Score (AUDIT) 4      Patient-reported   A score of 3 or more in women, and 4 or more in men indicates increased risk for alcohol abuse, EXCEPT if all of the points are from question 1   No results found for any visits on 05/16/24.  Assessment & Plan    Routine Health Maintenance and Physical Exam  Immunization History  Administered Date(s) Administered   Influenza Split 09/24/2009   Tdap 09/24/2009, 10/17/2019    Health Maintenance  Topic Date Due   COVID-19 Vaccine (1) Never done   HIV Screening  Never done   Hepatitis C Screening  Never done   Fecal DNA (Cologuard)  Never done   INFLUENZA VACCINE  07/13/2024   DTaP/Tdap/Td (3 - Td or Tdap) 10/16/2029   HPV VACCINES  Aged Out   Meningococcal B Vaccine  Aged Out    Problem List Items Addressed This Visit       Cardiovascular and Mediastinum   Primary hypertension   Relevant Orders   BMP8+EGFR     Other   Hypercholesteremia   Relevant Orders   Lipid panel   Avitaminosis D   Relevant Orders   VITAMIN D 25 Hydroxy (Vit-D Deficiency, Fractures)    Attention deficit hyperactivity disorder (ADHD), predominantly inattentive type   Relevant Orders   BMP8+EGFR   Annual physical exam - Primary   Chronic conditions are stable  Patient was counseled on benefits of regular physical activity with goal of 150 minutes of moderate to vigurous intensity 4 days per week  Patient was counseled to consume well balanced diet of fruits,  vegetables, limited saturated fats and limited sugary foods and beverages with emphasis on consuming 6-8 glasses of water daily  Screening recommended today: A1c, lipids,CMP,CBC,Hep C,HIV and vitamin D levels   Colon cancer screening: reports completed cologuard in 2023 with NEG results,       Vaccines recommended today: COVID(pt declines)       Other Visit Diagnoses       Screening for diabetes mellitus       Relevant Orders   Hemoglobin A1c     Screening for deficiency anemia       Relevant Orders   CBC     Screening for colon cancer         Screening for HIV (human immunodeficiency virus)       Relevant Orders   HIV Antibody (routine testing w rflx)     Encounter for hepatitis C screening test for low risk patient       Relevant Orders   Hepatitis C antibody        Assessment & Plan Orthostatic Hypotension Dizziness upon squatting and standing, lasting five to six seconds, possibly due to dehydration or antihypertensive medication. Symptoms subside post-blood donation, suggesting potential polycythemia from testosterone  therapy. - Increase water intake to address dehydration. - Review and adjust blood pressure management if symptoms persist. - Consider regular blood donation to manage potential polycythemia.  Benign Prostatic Hyperplasia (BPH) Urinary hesitancy and decreased stream. Current medication taken as needed, but symptoms suggest daily use may be necessary. Plans to discuss with urologist. - Consult urologist regarding medication regimen and potential daily use. - Continue current  medication as needed until urologist consultation.  Testosterone  Replacement Therapy Receiving testosterone  from non-pharmacy source, prefers pharmacy for quality assurance. Feels better with testosterone  levels in 600-700 range. Potential elevated hemoglobin due to therapy. - Discuss pharmacy-based testosterone  acquisition with urologist. - Monitor testosterone  levels to maintain desired range. - Consider regular blood donation to manage elevated hemoglobin.    No follow-ups on file.       Mimi Alt, MD  [facility] [phone] (phone) [phone] (fax)  [facility]

## 2024-05-17 ENCOUNTER — Ambulatory Visit: Payer: Self-pay | Admitting: Family Medicine

## 2024-05-17 LAB — HEMOGLOBIN A1C
Est. average glucose Bld gHb Est-mCnc: 103 mg/dL
Hgb A1c MFr Bld: 5.2 % (ref 4.8–5.6)

## 2024-05-17 LAB — BMP8+EGFR
BUN/Creatinine Ratio: 17 (ref 9–20)
BUN: 19 mg/dL (ref 6–24)
CO2: 24 mmol/L (ref 20–29)
Calcium: 9.9 mg/dL (ref 8.7–10.2)
Chloride: 101 mmol/L (ref 96–106)
Creatinine, Ser: 1.13 mg/dL (ref 0.76–1.27)
Glucose: 78 mg/dL (ref 70–99)
Potassium: 5.1 mmol/L (ref 3.5–5.2)
Sodium: 139 mmol/L (ref 134–144)
eGFR: 80 mL/min/{1.73_m2} (ref >59)

## 2024-05-17 LAB — VITAMIN D 25 HYDROXY (VIT D DEFICIENCY, FRACTURES): Vit D, 25-Hydroxy: 40.9 ng/mL (ref 30.0–100.0)

## 2024-05-17 LAB — CBC
Hematocrit: 53.2 % — ABNORMAL HIGH (ref 37.5–51.0)
Hemoglobin: 18.2 g/dL — ABNORMAL HIGH (ref 13.0–17.7)
MCH: 31.1 pg (ref 26.6–33.0)
MCHC: 34.2 g/dL (ref 31.5–35.7)
MCV: 91 fL (ref 79–97)
Platelets: 243 10*3/uL (ref 150–450)
RBC: 5.85 x10E6/uL — ABNORMAL HIGH (ref 4.14–5.80)
RDW: 12.7 % (ref 11.6–15.4)
WBC: 6.1 10*3/uL (ref 3.4–10.8)

## 2024-05-17 LAB — LIPID PANEL
Chol/HDL Ratio: 3.9 ratio (ref 0.0–5.0)
Cholesterol, Total: 175 mg/dL (ref 100–199)
HDL: 45 mg/dL (ref >39)
LDL Chol Calc (NIH): 110 mg/dL — ABNORMAL HIGH (ref 0–99)
Triglycerides: 108 mg/dL (ref 0–149)
VLDL Cholesterol Cal: 20 mg/dL (ref 5–40)

## 2024-05-17 LAB — HIV ANTIBODY (ROUTINE TESTING W REFLEX): HIV Screen 4th Generation wRfx: NONREACTIVE

## 2024-05-17 LAB — HEPATITIS C ANTIBODY: Hep C Virus Ab: NONREACTIVE

## 2024-06-09 ENCOUNTER — Other Ambulatory Visit: Payer: Self-pay | Admitting: Family Medicine

## 2024-06-09 DIAGNOSIS — I10 Essential (primary) hypertension: Secondary | ICD-10-CM

## 2024-06-11 NOTE — Telephone Encounter (Signed)
 Requested Prescriptions  Pending Prescriptions Disp Refills   lisinopril -hydrochlorothiazide  (ZESTORETIC ) 20-12.5 MG tablet [Pharmacy Med Name: LISINOPRIL -HCTZ 20-12.5 MG TAB] 90 tablet 1    Sig: TAKE 1 TABLET BY MOUTH EVERY DAY     Cardiovascular:  ACEI + Diuretic Combos Passed - 06/11/2024  5:43 PM      Passed - Na in normal range and within 180 days    Sodium  Date Value Ref Range Status  05/16/2024 139 134 - 144 mmol/L Final         Passed - K in normal range and within 180 days    Potassium  Date Value Ref Range Status  05/16/2024 5.1 3.5 - 5.2 mmol/L Final         Passed - Cr in normal range and within 180 days    Creatinine, Ser  Date Value Ref Range Status  05/16/2024 1.13 0.76 - 1.27 mg/dL Final         Passed - eGFR is 30 or above and within 180 days    GFR calc Af Amer  Date Value Ref Range Status  05/10/2019 >60 >60 mL/min Final   GFR calc non Af Amer  Date Value Ref Range Status  05/10/2019 >60 >60 mL/min Final   eGFR  Date Value Ref Range Status  05/16/2024 80 >59 mL/min/1.73 Final         Passed - Patient is not pregnant      Passed - Last BP in normal range    BP Readings from Last 1 Encounters:  05/16/24 126/85         Passed - Valid encounter within last 6 months    Recent Outpatient Visits           3 weeks ago Annual physical exam   [place] [facility] Sharma Coyer, MD       Future Appointments             In 2 months Stoioff, Glendia BROCKS, MD [facility] [place]

## 2024-06-18 DIAGNOSIS — F411 Generalized anxiety disorder: Secondary | ICD-10-CM | POA: Diagnosis not present

## 2024-08-05 ENCOUNTER — Other Ambulatory Visit: Payer: Self-pay | Admitting: Urology

## 2024-08-21 ENCOUNTER — Other Ambulatory Visit: Payer: Self-pay

## 2024-08-21 DIAGNOSIS — E291 Testicular hypofunction: Secondary | ICD-10-CM

## 2024-08-27 ENCOUNTER — Other Ambulatory Visit

## 2024-08-27 DIAGNOSIS — E291 Testicular hypofunction: Secondary | ICD-10-CM

## 2024-08-28 LAB — PSA: Prostate Specific Ag, Serum: 1.8 ng/mL (ref 0.0–4.0)

## 2024-08-28 LAB — TESTOSTERONE: Testosterone: 743 ng/dL (ref 264–916)

## 2024-08-28 LAB — HEMOGLOBIN AND HEMATOCRIT, BLOOD
Hematocrit: 50.5 % (ref 37.5–51.0)
Hemoglobin: 17 g/dL (ref 13.0–17.7)

## 2024-08-31 ENCOUNTER — Encounter: Payer: Self-pay | Admitting: Urology

## 2024-08-31 ENCOUNTER — Ambulatory Visit (INDEPENDENT_AMBULATORY_CARE_PROVIDER_SITE_OTHER): Admitting: Urology

## 2024-08-31 VITALS — BP 120/86 | HR 76 | Ht 68.0 in | Wt 176.0 lb

## 2024-08-31 DIAGNOSIS — R3911 Hesitancy of micturition: Secondary | ICD-10-CM | POA: Diagnosis not present

## 2024-08-31 DIAGNOSIS — N401 Enlarged prostate with lower urinary tract symptoms: Secondary | ICD-10-CM | POA: Diagnosis not present

## 2024-08-31 DIAGNOSIS — E291 Testicular hypofunction: Secondary | ICD-10-CM

## 2024-08-31 NOTE — Progress Notes (Signed)
 08/31/2024 10:01 AM   Arley JONETTA Alert 1976-03-07 [id number]  Referring provider: Sharma Coyer, MD [address] [place],  [place] [address]  Chief Complaint  Patient presents with   Hypogonadism    HPI: Joe Burns is a 48 y.o. male presents for follow-up of hypogonadism.  Only complaint has been lower urinary tract symptoms of urinary hesitancy and decreased stream will have urinary urgency ~once per week Taking alfuzosin  10 mg daily which improved the symptoms though he does not always remember to take it regularly.  He started a supplement with saw palmetto 2 days ago. Labs 08/27/2024: Testosterone  743 ng/dL; Y/Y82.9/49.4; PSA 1.8   PMH: Past Medical History:  Diagnosis Date   Annual physical exam 05/16/2024   Anxiety 2013   Currently taking  Adderall.  This has curbed 80-90% of the anxiety.   Arthritis 2010   Hands and knees   Cancer (HCC) 2009   Melanoma on my back.   Depression 2010   No longer a problem   Hyperlipidemia    Hypertension    Kidney stones     Surgical History: Past Surgical History:  Procedure Laterality Date   COSMETIC SURGERY  2009   Removal of basal cell on face   KNEE SURGERY Right    MOHS SURGERY      Home Medications:  Allergies as of 08/31/2024   No Known Allergies      Medication List        Accurate as of August 31, 2024 10:01 AM. If you have any questions, ask your nurse or doctor.          alfuzosin  10 MG 24 hr tablet Commonly known as: UROXATRAL  TAKE 1 TABLET (10 MG TOTAL) BY MOUTH DAILY WITH BREAKFAST.   amphetamine-dextroamphetamine 20 MG 24 hr capsule Commonly known as: ADDERALL XR Take 20 mg by mouth daily.   atorvastatin  10 MG tablet Commonly known as: LIPITOR Take 1 tablet (10 mg total) by mouth daily.   lisinopril -hydrochlorothiazide  20-12.5 MG tablet Commonly known as: ZESTORETIC  TAKE 1 TABLET BY MOUTH EVERY DAY        Allergies: No Known Allergies  Family  History: Family History  Problem Relation Age of Onset   Hyperlipidemia Mother    Aneurysm Mother        AORTIC ABDOMINAL-REPAIRED   Anxiety disorder Mother    Arthritis Mother    Depression Mother    Heart attack Father        IN HIS 50S   Alcohol abuse Father    Arthritis Father    Hearing loss Father    Heart disease Father    Hypertension Father    Heart disease Paternal Grandfather    Hypertension Paternal Grandfather    Diabetes Maternal Aunt    Drug abuse Paternal Uncle     Social History:  reports that he has never smoked. His smokeless tobacco use includes chew. He reports current alcohol use of about 11.0 standard drinks of alcohol per week. He reports that he does not use drugs.   Physical Exam: BP 120/86   Pulse 76   Ht 5' 8 (1.727 m)   Wt 176 lb (79.8 kg)   BMI 26.76 kg/m   Constitutional:  Alert, No acute distress. HEENT: [place] AT Respiratory: Normal respiratory effort, no increased work of breathing. Psychiatric: Normal mood and affect.   Assessment & Plan:    1.  Hypogonadism Labs look good Recommend he donate blood every 3-4 months  Lab visit 6 months testosterone , H/H Office visit 1 year testosterone , H/H, PSA  2.  BPH with LUTS Symptom improvement on alfuzosin  Just started saw palmetto 2 days ago Instructed call earlier for any worsening of his lower urinary tract symptoms   Glendia JAYSON Barba, MD  [facility] [address], [address] [place], [place] [address] [phone]

## 2024-09-03 DIAGNOSIS — H0288A Meibomian gland dysfunction right eye, upper and lower eyelids: Secondary | ICD-10-CM | POA: Diagnosis not present

## 2024-09-06 ENCOUNTER — Other Ambulatory Visit: Payer: Self-pay | Admitting: Family Medicine

## 2024-09-06 DIAGNOSIS — E7800 Pure hypercholesterolemia, unspecified: Secondary | ICD-10-CM

## 2024-09-17 DIAGNOSIS — F411 Generalized anxiety disorder: Secondary | ICD-10-CM | POA: Diagnosis not present

## 2024-09-17 DIAGNOSIS — F900 Attention-deficit hyperactivity disorder, predominantly inattentive type: Secondary | ICD-10-CM | POA: Diagnosis not present

## 2024-12-13 ENCOUNTER — Other Ambulatory Visit: Payer: Self-pay | Admitting: Family Medicine

## 2024-12-13 DIAGNOSIS — I10 Essential (primary) hypertension: Secondary | ICD-10-CM

## 2025-03-01 ENCOUNTER — Other Ambulatory Visit

## 2025-08-30 ENCOUNTER — Other Ambulatory Visit

## 2025-09-03 ENCOUNTER — Ambulatory Visit: Admitting: Urology
